# Patient Record
Sex: Female | Born: 1970 | Race: White | Hispanic: No | Marital: Married | Smoking: Never smoker
Health system: Southern US, Community
[De-identification: ages and names within clinical notes are randomized; demographics above are authoritative.]

## PROBLEM LIST (undated history)

## (undated) DIAGNOSIS — E663 Overweight: Secondary | ICD-10-CM

## (undated) DIAGNOSIS — I499 Cardiac arrhythmia, unspecified: Secondary | ICD-10-CM

## (undated) DIAGNOSIS — O24419 Gestational diabetes mellitus in pregnancy, unspecified control: Secondary | ICD-10-CM

## (undated) DIAGNOSIS — F32A Depression, unspecified: Secondary | ICD-10-CM

## (undated) DIAGNOSIS — N809 Endometriosis, unspecified: Secondary | ICD-10-CM

## (undated) DIAGNOSIS — G2581 Restless legs syndrome: Secondary | ICD-10-CM

## (undated) DIAGNOSIS — F329 Major depressive disorder, single episode, unspecified: Secondary | ICD-10-CM

## (undated) DIAGNOSIS — F319 Bipolar disorder, unspecified: Secondary | ICD-10-CM

## (undated) HISTORY — DX: Endometriosis, unspecified: N80.9

## (undated) HISTORY — DX: Gestational diabetes mellitus in pregnancy, unspecified control: O24.419

## (undated) HISTORY — PX: BUNIONECTOMY: SHX129

## (undated) HISTORY — DX: Restless legs syndrome: G25.81

## (undated) HISTORY — PX: CHOLECYSTECTOMY: SHX55

## (undated) HISTORY — DX: Depression, unspecified: F32.A

## (undated) HISTORY — DX: Bipolar disorder, unspecified: F31.9

## (undated) HISTORY — DX: Overweight: E66.3

## (undated) HISTORY — PX: LAPAROSCOPY: SHX197

## (undated) HISTORY — DX: Major depressive disorder, single episode, unspecified: F32.9

## (undated) HISTORY — DX: Cardiac arrhythmia, unspecified: I49.9

## (undated) HISTORY — PX: FOOT SURGERY: SHX648

---

## 1998-08-22 HISTORY — PX: VAGINAL HYSTERECTOMY: SUR661

## 1998-08-22 HISTORY — PX: TOTAL VAGINAL HYSTERECTOMY: SHX2548

## 2008-02-25 ENCOUNTER — Ambulatory Visit: Payer: Self-pay | Admitting: Family Medicine

## 2008-06-22 ENCOUNTER — Ambulatory Visit: Payer: Self-pay | Admitting: Family Medicine

## 2008-06-28 ENCOUNTER — Ambulatory Visit: Payer: Self-pay | Admitting: General Surgery

## 2009-01-18 ENCOUNTER — Ambulatory Visit: Payer: Self-pay | Admitting: Obstetrics and Gynecology

## 2009-04-16 ENCOUNTER — Ambulatory Visit: Payer: Self-pay | Admitting: Family Medicine

## 2010-02-28 DIAGNOSIS — E039 Hypothyroidism, unspecified: Secondary | ICD-10-CM | POA: Insufficient documentation

## 2010-12-24 HISTORY — PX: OTHER SURGICAL HISTORY: SHX169

## 2011-01-23 ENCOUNTER — Ambulatory Visit: Payer: Self-pay | Admitting: Obstetrics and Gynecology

## 2011-02-04 ENCOUNTER — Ambulatory Visit: Payer: Self-pay | Admitting: Obstetrics and Gynecology

## 2012-03-22 ENCOUNTER — Ambulatory Visit: Payer: Self-pay | Admitting: Obstetrics and Gynecology

## 2013-03-23 ENCOUNTER — Ambulatory Visit: Payer: Self-pay | Admitting: Obstetrics and Gynecology

## 2014-06-19 ENCOUNTER — Ambulatory Visit: Payer: Self-pay | Admitting: Obstetrics and Gynecology

## 2014-06-19 LAB — HM MAMMOGRAPHY

## 2014-06-26 LAB — LIPID PANEL
Cholesterol: 175 mg/dL (ref 0–200)
HDL: 52 mg/dL (ref 35–70)
LDL Cholesterol: 112 mg/dL
TRIGLYCERIDES: 56 mg/dL (ref 40–160)

## 2015-01-05 ENCOUNTER — Ambulatory Visit: Payer: Self-pay | Admitting: Family Medicine

## 2015-05-08 ENCOUNTER — Other Ambulatory Visit: Payer: Self-pay | Admitting: Obstetrics and Gynecology

## 2015-05-08 DIAGNOSIS — Z1231 Encounter for screening mammogram for malignant neoplasm of breast: Secondary | ICD-10-CM

## 2015-05-11 LAB — BASIC METABOLIC PANEL
BUN: 5 mg/dL (ref 4–21)
CREATININE: 0.7 mg/dL (ref 0.5–1.1)
Glucose: 71 mg/dL
POTASSIUM: 4.4 mmol/L (ref 3.4–5.3)
Sodium: 140 mmol/L (ref 137–147)

## 2015-05-11 LAB — TSH: TSH: 2.95 u[IU]/mL (ref 0.41–5.90)

## 2015-05-25 ENCOUNTER — Ambulatory Visit: Payer: Self-pay | Admitting: Podiatry

## 2015-05-25 ENCOUNTER — Ambulatory Visit (INDEPENDENT_AMBULATORY_CARE_PROVIDER_SITE_OTHER): Payer: No Typology Code available for payment source | Admitting: Podiatry

## 2015-05-25 ENCOUNTER — Encounter: Payer: Self-pay | Admitting: Podiatry

## 2015-05-25 ENCOUNTER — Ambulatory Visit (INDEPENDENT_AMBULATORY_CARE_PROVIDER_SITE_OTHER): Payer: No Typology Code available for payment source

## 2015-05-25 VITALS — BP 128/86 | HR 68 | Resp 16 | Ht 64.0 in | Wt 170.0 lb

## 2015-05-25 DIAGNOSIS — M779 Enthesopathy, unspecified: Secondary | ICD-10-CM

## 2015-05-25 DIAGNOSIS — M6740 Ganglion, unspecified site: Secondary | ICD-10-CM | POA: Diagnosis not present

## 2015-05-25 DIAGNOSIS — G5792 Unspecified mononeuropathy of left lower limb: Secondary | ICD-10-CM

## 2015-05-25 DIAGNOSIS — M7752 Other enthesopathy of left foot: Secondary | ICD-10-CM

## 2015-05-25 DIAGNOSIS — M778 Other enthesopathies, not elsewhere classified: Secondary | ICD-10-CM

## 2015-05-25 NOTE — Progress Notes (Signed)
   Subjective:    Patient ID: Briana Fox, female    DOB: [birth year], 44 y.o.   MRN: [id number]  HPI knot on the top of the left foot , it has been about a month and it is causing pain to go throughout my foot     Review of Systems     Objective:   Physical Exam: I have reviewed her past medical history medications allergies surgery social history and review of systems. Pulses are strongly palpable bilateral. Neurologic sensorium is intact per Semmes-Weinstein monofilament. Deep tendon reflexes are intact bilateral and muscle strength is 5 over 5 dorsiflexors plantar flexors and inverters everters all intrinsic musculature is intact. Orthopedic evaluation and stripped all joints distal to the ankle, full range of motion without crepitation. She has dorsal spurring and on pulsatile nodules in the dorsal aspect of the second metatarsal intermediate cuneiform joint. This area is tender on palpation with radiating pains distally. Radiographs demonstrate some dorsal spurring but minimally so. Early osteoarthritic changes are noted on AP view.        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  Plan: Injected the left foot today with dexamethasone and local and aesthetic discussed appropriate shoe gear stretching exercises and ice therapy. Discussed the possible need for surgical intervention in the near future.

## 2015-05-28 ENCOUNTER — Ambulatory Visit: Payer: Self-pay | Admitting: Podiatry

## 2015-06-05 ENCOUNTER — Telehealth: Payer: Self-pay | Admitting: *Deleted

## 2015-06-05 MED ORDER — MELOXICAM 7.5 MG PO TABS: 7.5000 mg | ORAL_TABLET | Freq: Every day | ORAL | Status: DC

## 2015-06-05 NOTE — Telephone Encounter (Addendum)
Pt's husband, Abbe Amsterdam called stating pt is requesting a stronger Tylenol medication than the OTC.  Dr. Milinda Pointer ordered Mobic 7.5mg  #30 1 tablet daily, and Tramadol 50mg  #30 one tablet every 8 hours.  Orders to pt and Mobic to [facility] and Caryl Pina of [place] will write for the Tramadol to be picked up in their office.

## 2015-06-06 MED ORDER — TRAMADOL HCL 50 MG PO TABS: 50.0000 mg | ORAL_TABLET | Freq: Three times a day (TID) | ORAL | Status: DC | PRN

## 2015-06-06 NOTE — Telephone Encounter (Signed)
Wrote Tramadol 50 mg #30 for pt and will pick up at front desk at her convenience today.

## 2015-06-07 ENCOUNTER — Ambulatory Visit (INDEPENDENT_AMBULATORY_CARE_PROVIDER_SITE_OTHER): Payer: No Typology Code available for payment source | Admitting: Podiatry

## 2015-06-07 ENCOUNTER — Ambulatory Visit: Payer: No Typology Code available for payment source | Admitting: Podiatry

## 2015-06-07 VITALS — BP 120/71 | HR 86 | Resp 16

## 2015-06-07 DIAGNOSIS — M6740 Ganglion, unspecified site: Secondary | ICD-10-CM

## 2015-06-07 DIAGNOSIS — G5792 Unspecified mononeuropathy of left lower limb: Secondary | ICD-10-CM | POA: Diagnosis not present

## 2015-06-07 DIAGNOSIS — M779 Enthesopathy, unspecified: Secondary | ICD-10-CM

## 2015-06-07 DIAGNOSIS — M778 Other enthesopathies, not elsewhere classified: Secondary | ICD-10-CM

## 2015-06-07 DIAGNOSIS — M7752 Other enthesopathy of left foot: Secondary | ICD-10-CM | POA: Diagnosis not present

## 2015-06-07 NOTE — Progress Notes (Signed)
Patient ID: Briana Fox, female   DOB: March 24, 1971, 44 y.o.   MRN: [id number]  Subjective: 44 year old female presents the office they for followup evaluation of left foot pain which has been ongoing for several months. She last saw Dr. Milinda Pointer earlier this month which had a steroid injection to the area which seem to help for approximately 2 weeks before the pain started to recur. She is also taking anti-inflammatories which seem to help some although she does continue pain on the top of her foot. She also states that she has some numbness and tingling shooting over this lesion down to her toes and between the hallux and second toe. She does have some pain in the outside aspect of her foot with weightbearing and prolonged ambulation. She denies any history of injury or trauma. Denies any systemic complaints such as fevers, chills, nausea, vomiting. No acute changes since last appointment, and no other complaints at this time.   Objective: AAO x3, NAD DP/PT pulses palpable bilaterally, CRT less than 3 seconds Protective sensation intact with Simms Weinstein monofilament, vibratory sensation intact, Achilles tendon reflex intact On the dorsal aspect of the left midfoot just lateral to the dorsalis pedis there is a small and calluses fluid-filled soft tissue mass with underlying bony prominences palpable. There is tenderness to palpation overlying this area. Upon percussion the area does appear to be radiation of the deep peroneal nerve in between the hallux and second digit. Subjectively there is decreased sensation to the lateral hallux and medial second digit. There is an increase in medial arch height upon weightbearing. Upon gait evaluation she does tend to walk on the lateral aspect of her foot. Mild tailor's bunion deformity.  No other areas of pinpoint bony tenderness or pain with vibratory sensation. MMT 5/5, ROM WNL. No edema, erythema, increase in warmth to bilateral lower extremities.  No open  lesions or pre-ulcerative lesions.  No pain with calf compression, swelling, warmth, erythema  Assessment: 44 year old female with left dorsal midfoot exostosis, soft tissue mass likely causing neuritis symptoms.  Plan: -Previous x-rays were discussed reviewed the patient. -All treatment options discussed with the patient including all alternatives, risks, complications.  -At this time the patient has attempted conservative treatment including but not limited to injections, anti-inflammatories, shoe gear modifications, padding, offloading without any resolution of symptoms. At this time she is requesting surgical intervention to help decrease her pain and deformity. -Discussed surgical intervention with the patient including left dorsal midfoot exostectomy a soft tissue mass excision. Should have the surgery performed on Friday and would like Dr. Milinda Pointer to preform the surgery. I will discuss this with him.  -I discussed the surgery will hopefully help decrease some of the symptoms she has to to her foot and we have reviewed all of symptoms. Given her high arch foot she would likely benefit from orthotics will help take pressure off of her foot. -The incision placement as well as the postoperative course was discussed with the patient. I discussed risks of the surgery which include, but not limited to, infection, bleeding, pain, swelling, need for further surgery, delayed or nonhealing, painful or ugly scar, numbness or sensation changes, over/under correction, recurrence, transfer lesions, further deformity, hardware failure, DVT/PE, loss of toe/foot. Patient understands these risks and wishes to proceed with surgery. The surgical consent was reviewed with the patient all 3 pages were signed. No promises or guarantees were given to the outcome of the procedure. All questions were answered to the best of my  ability. Before the surgery the patient was encouraged to call the office if there is any further  questions. The surgery will be performed at the [facility] on an outpatient basis.  -Patient encouraged to call the office with any questions, concerns, change in symptoms.

## 2015-06-07 NOTE — Patient Instructions (Signed)
Pre-Operative Instructions  Congratulations, you have decided to take an important step to improving your quality of life.  You can be assured that the doctors of [facility] will be with you every step of the way.  1. Plan to be at the surgery center/hospital at least 1 (one) hour prior to your scheduled time unless otherwise directed by the surgical center/hospital staff.  You must have a responsible adult accompany you, remain during the surgery and drive you home.  Make sure you have directions to the surgical center/hospital and know how to get there on time. 2. For hospital based surgery you will need to obtain a history and physical form from your family physician within 1 month prior to the date of surgery- we will give you a form for you primary physician.  3. We make every effort to accommodate the date you request for surgery.  There are however, times where surgery dates or times have to be moved.  We will contact you as soon as possible if a change in schedule is required.   4. No Aspirin/Ibuprofen for one week before surgery.  If you are on aspirin, any non-steroidal anti-inflammatory medications (Mobic, Aleve, Ibuprofen) you should stop taking it 7 days prior to your surgery.  You make take Tylenol  For pain prior to surgery.  5. Medications- If you are taking daily heart and blood pressure medications, seizure, reflux, allergy, asthma, anxiety, pain or diabetes medications, make sure the surgery center/hospital is aware before the day of surgery so they may notify you which medications to take or avoid the day of surgery. 6. No food or drink after midnight the night before surgery unless directed otherwise by surgical center/hospital staff. 7. No alcoholic beverages 24 hours prior to surgery.  No smoking 24 hours prior to or 24 hours after surgery. 8. Wear loose pants or shorts- loose enough to fit over bandages, boots, and casts. 9. No slip on shoes, sneakers are best. 10. Bring  your boot with you to the surgery center/hospital.  Also bring crutches or a walker if your physician has prescribed it for you.  If you do not have this equipment, it will be provided for you after surgery. 11. If you have not been contracted by the surgery center/hospital by the day before your surgery, call to confirm the date and time of your surgery. 12. Leave-time from work may vary depending on the type of surgery you have.  Appropriate arrangements should be made prior to surgery with your employer. 13. Prescriptions will be provided immediately following surgery by your doctor.  Have these filled as soon as possible after surgery and take the medication as directed. 14. Remove nail polish on the operative foot. 15. Wash the night before surgery.  The night before surgery wash the foot and leg well with the antibacterial soap provided and water paying special attention to beneath the toenails and in between the toes.  Rinse thoroughly with water and dry well with a towel.  Perform this wash unless told not to do so by your physician.  Enclosed: 1 Ice pack (please put in freezer the night before surgery)   1 Hibiclens skin cleaner   Pre-op Instructions  If you have any questions regarding the instructions, do not hesitate to call our office.  [place]: [address] [place], [place] [address] [phone]  [place]: [address] , [place] [address] [phone]  [place]: [address]  [place], [place] [address] [phone]  Dr. Richard   Tuchman DPM, Dr. Norman Regal DPM Dr. Richard Sikora DPM, Dr. M. Todd Hyatt DPM, Dr. Kathryn Egerton DPM 

## 2015-06-22 ENCOUNTER — Encounter: Payer: Self-pay | Admitting: Podiatry

## 2015-06-27 ENCOUNTER — Other Ambulatory Visit: Payer: Self-pay | Admitting: Podiatry

## 2015-06-27 MED ORDER — PROMETHAZINE HCL 25 MG PO TABS: 25.0000 mg | ORAL_TABLET | Freq: Three times a day (TID) | ORAL | Status: DC | PRN

## 2015-06-27 MED ORDER — CEPHALEXIN 500 MG PO CAPS: 500.0000 mg | ORAL_CAPSULE | Freq: Three times a day (TID) | ORAL | Status: DC

## 2015-06-27 MED ORDER — OXYCODONE-ACETAMINOPHEN 10-325 MG PO TABS: 1.0000 | ORAL_TABLET | Freq: Four times a day (QID) | ORAL | Status: DC | PRN

## 2015-06-29 DIAGNOSIS — M25775 Osteophyte, left foot: Secondary | ICD-10-CM | POA: Diagnosis not present

## 2015-06-29 DIAGNOSIS — M6740 Ganglion, unspecified site: Secondary | ICD-10-CM | POA: Diagnosis not present

## 2015-07-05 ENCOUNTER — Ambulatory Visit (INDEPENDENT_AMBULATORY_CARE_PROVIDER_SITE_OTHER): Payer: No Typology Code available for payment source

## 2015-07-05 ENCOUNTER — Ambulatory Visit (INDEPENDENT_AMBULATORY_CARE_PROVIDER_SITE_OTHER): Payer: No Typology Code available for payment source | Admitting: Podiatry

## 2015-07-05 VITALS — BP 120/71 | HR 77 | Temp 97.8°F | Resp 16

## 2015-07-05 DIAGNOSIS — Z9889 Other specified postprocedural states: Secondary | ICD-10-CM

## 2015-07-05 DIAGNOSIS — M898X9 Other specified disorders of bone, unspecified site: Secondary | ICD-10-CM

## 2015-07-06 NOTE — Progress Notes (Signed)
Patient ID: Briana Fox, female   DOB: 10/09/1971, 44 y.o.   MRN: [id number]  DOS: 06/29/15 s/p Left dorsal exostectomy, soft tissue mass excision  Subjective: 44 year old female presents the office today postop visit #1 status post left dorsal exostosis with Dr. Milinda Pointer. She states that overall she is having more pain that she's had from her other surgeries. She is increase the frequency that she is taking the Percocet which seems to help. She does that she tries to care for her grandson and she is on her feet quite a bit. She's been taking antibiotic as directed and she didn't need significant again for couple days after surgery due to nausea however the has resolved. She denies any systemic complaints as fevers, chills, nausea, vomiting. Denies any calf pain, chest pain, shortness of breath. No other complaints at this time in no acute changes.  Objective: AAO x3, NAD DP/PT pulses palpable bilaterally, CRT less than 3 seconds Protective sensation intact with Simms Weinstein monofilament  Incisional a dorsal aspect of the left midfoot as well as left without any evidence of dehiscence and sutures are intact. There is edema overlying the surgical site. There is a faint amount of erythema likely from inflammation surrounding the incision however there is no increase in warmth to the area and there is no ascending cellulitis. There is no drainage, purulence, malodor. There is mild tenderness to palpation overlying the incision.  No other areas of tenderness to bilateral lower extremities. MMT 5/5, ROM WNL.  No open lesions or pre-ulcerative lesions.  No overlying edema, erythema, increase in warmth to bilateral lower extremities.  No pain with calf compression, swelling, warmth, erythema bilaterally.   Assessment: 44 year old female 1 week status post left dorsal exostectomy  Plan: -Treatment options discussed including all alternatives, risks, and complications -X-rays were obtained and reviewed  with the patient.  -Antibiotic ointment was placed over the incision followed by dry sterile dressing. Keep his dressing clean, dry, intact. -Continue a surgical shoe. -Continue pain medication. -Monitor for any clinical signs or symptoms of infection and directed to call the office immediately should any occur or go to the ER. -Follow-up 1 week for suture removal or sooner if any problems arise. In the meantime, encouraged to call the office with any questions, concerns, change in symptoms.   Celesta Gentile, DPM

## 2015-07-09 ENCOUNTER — Telehealth: Payer: Self-pay | Admitting: *Deleted

## 2015-07-09 MED ORDER — OXYCODONE-ACETAMINOPHEN 10-325 MG PO TABS
1.0000 | ORAL_TABLET | Freq: Four times a day (QID) | ORAL | Status: DC | PRN
Start: 2015-07-09 — End: 2016-06-17

## 2015-07-09 NOTE — Telephone Encounter (Addendum)
Pt states she would like a refill of her Percocet.  Informed pt that the Percocet could be refilled, but she would have to pick it up in the [place] office.  Pt states she will have her husband pick it up.

## 2015-07-09 NOTE — Telephone Encounter (Signed)
OK to refill

## 2015-07-16 ENCOUNTER — Ambulatory Visit (INDEPENDENT_AMBULATORY_CARE_PROVIDER_SITE_OTHER): Payer: No Typology Code available for payment source | Admitting: Podiatry

## 2015-07-16 VITALS — BP 129/72 | HR 72 | Temp 99.1°F | Resp 16

## 2015-07-16 DIAGNOSIS — Z9889 Other specified postprocedural states: Secondary | ICD-10-CM

## 2015-07-16 NOTE — Progress Notes (Signed)
She presents today 2 weeks status post excision dorsal exostosis left foot. 2 weeks status post decompression of deep peroneal nerve left foot. Currently she was to get the foot wet as soon as she possibly can.  Objective: Dry sterile dressing was removed demonstrates palpable pulse left incision line appears to be healing but there is mild dehiscence of the proximal most portion of the wound. I see no signs of infection just some blood. Vital signs are stable she is alert and oriented 3. Pulses are palpable.  Assessment: Well-healing surgical foot mild dehiscence proximal wound left dorsal.  Plan: Redressed today for the next 2 weeks continue to keep dry for 2 more weeks follow up with me at that time.

## 2015-07-18 NOTE — Progress Notes (Signed)
DOS 06/22/2015 Left foot exostectomy (removal of bone spur) with removal of soft tissue mass.

## 2015-08-01 ENCOUNTER — Ambulatory Visit (INDEPENDENT_AMBULATORY_CARE_PROVIDER_SITE_OTHER): Payer: No Typology Code available for payment source

## 2015-08-01 ENCOUNTER — Ambulatory Visit (INDEPENDENT_AMBULATORY_CARE_PROVIDER_SITE_OTHER): Payer: No Typology Code available for payment source | Admitting: Podiatry

## 2015-08-01 VITALS — BP 98/60 | HR 80 | Resp 16

## 2015-08-01 DIAGNOSIS — M898X9 Other specified disorders of bone, unspecified site: Secondary | ICD-10-CM

## 2015-08-01 DIAGNOSIS — Z9889 Other specified postprocedural states: Secondary | ICD-10-CM

## 2015-08-01 NOTE — Progress Notes (Signed)
She presents today for follow-up of a dorsal tarsal exostectomy and a deep peroneal nerve decompression left foot. She relates some numbness but states that all in all seems to be doing much better. She states that she's been dancing.  Objective: Vital signs are stable she is alert and oriented 3. Pulses are palpable no erythema edema cellulitis drainage or odor sutures are intact margins well coapted.  Assessment: Well-healing surgical dorsal tarsal exostectomy left foot.  Plan: get back to regular shoe gear and I will follow-up with her about a month.

## 2015-08-23 ENCOUNTER — Encounter: Payer: Self-pay | Admitting: Podiatry

## 2015-08-23 ENCOUNTER — Ambulatory Visit (INDEPENDENT_AMBULATORY_CARE_PROVIDER_SITE_OTHER): Payer: No Typology Code available for payment source

## 2015-08-23 ENCOUNTER — Ambulatory Visit (INDEPENDENT_AMBULATORY_CARE_PROVIDER_SITE_OTHER): Payer: No Typology Code available for payment source | Admitting: Podiatry

## 2015-08-23 VITALS — BP 117/68 | HR 59 | Resp 16

## 2015-08-23 DIAGNOSIS — M79672 Pain in left foot: Secondary | ICD-10-CM | POA: Diagnosis not present

## 2015-08-23 DIAGNOSIS — M2042 Other hammer toe(s) (acquired), left foot: Secondary | ICD-10-CM

## 2015-08-23 DIAGNOSIS — S92912A Unspecified fracture of left toe(s), initial encounter for closed fracture: Secondary | ICD-10-CM

## 2015-08-23 NOTE — Progress Notes (Signed)
Patient ID: Briana Fox, female   DOB: 28-Sep-1971, 44 y.o.   MRN: [id number]  Subjective: 44 year old female presents the office with concerns of left fifth toe pain. She states that she hit her fifth toe and chair approximately 3 weeks ago and since she's had pain to the area as well as swelling and redness. She states that she is unable to wear a regular shoe 2 the pain and swelling. She's tried to tape her toes the other which has become very painful. She also states that her fifth toe is starting to drift outwards more than she injured it. Otherwise she's had no other treatment. She states that he often gets her fifth toe caught as it is "floppy". She states that she gets some intermittent swelling after the surgery but that is improving. She has no pain to the surgical site.No other complaints time in no acute changes.  Objective: AAO x3, NAD DP/PT pulses palpable, CRT less than 3 seconds Protective sensation appears to be intact with Derrel Nip monofilament There is mild edema and erythema to the left fifth toe. There is tenderness to palpation over the distal aspect of the digit. Currently there is no tenderness or swelling around the fifth metatarsal head however subjectively she does that she has pain and swelling there as she is on her feet more. The fifth toe does sit in a slightly abducted position compared to the other digits. There is no other areas of tenderness to bilateral lower extremities. The scar is well formed on the dorsal aspect of the midfoot from recent surgery. There is traceedema upon area. There is no overlying erythema or increase in warmth. There is notation incision there is no sign of infection. No open lesions or pre-ulcer lesions. No pain with calf compression, swelling, warmth, erythema.  Assessment: 44 year old female with left fifth toe fracture, with chronic deformity fifth toe.  Plan: -Treatment options discussed including all alternatives, risks, and  complications' -X-rays were obtained and reviewed with the patient. There is a radiolucent line across the distal portion of the toe consistent with a fracture. -Continue taping of the toe. I discussed to wear surgical shoe as needed which she has a home. Ice and elevation. -In the future due to the abducted position of the toe I discussed with her webbing procedure the fourth and fifth toe. She states she lately undergo this in the fall. -Follow-up in 4 weeks or sooner if any problems arise. In the meantime, encouraged to call the office with any questions, concerns, change in symptoms. We'll likely discuss surgery at the next appointment.  Celesta Gentile, DPM

## 2015-09-03 ENCOUNTER — Encounter: Payer: No Typology Code available for payment source | Admitting: Podiatry

## 2015-09-06 ENCOUNTER — Encounter: Payer: Self-pay | Admitting: Podiatry

## 2015-09-06 ENCOUNTER — Ambulatory Visit (INDEPENDENT_AMBULATORY_CARE_PROVIDER_SITE_OTHER): Payer: No Typology Code available for payment source

## 2015-09-06 ENCOUNTER — Ambulatory Visit (INDEPENDENT_AMBULATORY_CARE_PROVIDER_SITE_OTHER): Payer: No Typology Code available for payment source | Admitting: Podiatry

## 2015-09-06 VITALS — BP 130/69 | HR 77 | Resp 18

## 2015-09-06 DIAGNOSIS — R52 Pain, unspecified: Secondary | ICD-10-CM

## 2015-09-06 DIAGNOSIS — M779 Enthesopathy, unspecified: Secondary | ICD-10-CM

## 2015-09-06 DIAGNOSIS — M2042 Other hammer toe(s) (acquired), left foot: Secondary | ICD-10-CM

## 2015-09-06 DIAGNOSIS — M792 Neuralgia and neuritis, unspecified: Secondary | ICD-10-CM

## 2015-09-06 DIAGNOSIS — S92912D Unspecified fracture of left toe(s), subsequent encounter for fracture with routine healing: Secondary | ICD-10-CM

## 2015-09-06 MED ORDER — DICLOFENAC SODIUM 1 % TD GEL: 2.0000 g | Freq: Four times a day (QID) | TRANSDERMAL | Status: DC

## 2015-09-06 NOTE — Progress Notes (Signed)
Patient ID: Briana Fox, female   DOB: 1971-03-15, 44 y.o.   MRN: [id number]  Subjective: 44 year old female presents the office today for follow-up evaluation left fifth toe fracture. She states that she is doing well and she is able to wear a regular shoe. She has no discomfort to the toe and she states the swelling has decreased. She discontinues her that her fifth toe get caught if she goes barefoot which causes pain intermittently. She elected to further discuss surgery for the toe. She states that she is having some discomfort around the surgical site over the previous exostectomy. She states that she was doing well until she started to increase her dancing as he gets pain over the surgical site which goes into her ankle. She states that she has had to stop dancing due to the pain. She states that she doesn't have pain with vibratory past she increases activity she has discomfort. She describes it as a burning pain as well as a throbbing pain into her ankle. She purchase an over-the-counter ankle brace which is not providing much support and she is inquiring about other braces. No other complaints at this time.  Objective: AAO 3, NAD Neurovascular status intact and unchanged Incisional the dorsal aspect of the left foot along the recent surgery is well coapted and the scar has formed. There is no tenderness to palpation overlying the area. Negative Tinel sign. Subjective there is pain on the anterior aspect of the ankle on the proximal portion of the incision. There is no pinpoint bony tenderness or pain the vibratory sensation. There is no restriction or crepitation with ankle joint range of motion. 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 At this time there is no tenderness to palpation overlying the fifth digit there is no swelling. Subjectively she states the fifth toe causes pain and she gets toe caught. The fifth toe is somewhat "floppy". No other areas of  tenderness to bilateral lower joints. No other open lesions or pre-ulcerative lesions. There is no pain with calf compression, swelling, warmth, erythema.  Assessment: 44 year old female with fifth toe pain, hammertoe fourth toe; discomfort with activity on the proximal portion of the incision from the exostectomy; healing fracture fifth toe  Plan: -X-rays were obtained and reviewed with the patient.  -Treatment options discussed including all alternatives, risks, and complications -At this time she continues to the fifth toe for stabilization help with swelling as needed. Appears to be healing on x-ray. No further problems at this time. Continue with regular shoe gear as tolerated. 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 She would have surgery in October 26. Date was reserved for her. We will do the consent closer to do surgery. -Dispensed Tri-Lock ankle brace to wear as needed when dancing. Prescribed Voltaren gel. Rehabilitation exercises discussed. -Follow-up in 3 weeks or sooner if any problems arise. In the meantime, encouraged to call the office with any questions, concerns, change in symptoms.   Celesta Gentile, DPM

## 2015-09-27 ENCOUNTER — Telehealth: Payer: Self-pay | Admitting: *Deleted

## 2015-09-27 ENCOUNTER — Ambulatory Visit (INDEPENDENT_AMBULATORY_CARE_PROVIDER_SITE_OTHER): Payer: No Typology Code available for payment source

## 2015-09-27 ENCOUNTER — Encounter: Payer: Self-pay | Admitting: Podiatry

## 2015-09-27 ENCOUNTER — Ambulatory Visit (INDEPENDENT_AMBULATORY_CARE_PROVIDER_SITE_OTHER): Payer: No Typology Code available for payment source | Admitting: Podiatry

## 2015-09-27 VITALS — BP 117/73 | HR 76 | Resp 18

## 2015-09-27 DIAGNOSIS — M2528 Flail joint, other site: Secondary | ICD-10-CM | POA: Diagnosis not present

## 2015-09-27 DIAGNOSIS — M79672 Pain in left foot: Secondary | ICD-10-CM

## 2015-09-27 DIAGNOSIS — R52 Pain, unspecified: Secondary | ICD-10-CM | POA: Diagnosis not present

## 2015-09-27 DIAGNOSIS — Z9889 Other specified postprocedural states: Secondary | ICD-10-CM

## 2015-09-27 DIAGNOSIS — M2042 Other hammer toe(s) (acquired), left foot: Secondary | ICD-10-CM | POA: Diagnosis not present

## 2015-09-27 NOTE — Telephone Encounter (Signed)
Entered in error.  Patient has 2 charts. 

## 2015-09-27 NOTE — Patient Instructions (Signed)
Pre-Operative Instructions  Congratulations, you have decided to take an important step to improving your quality of life.  You can be assured that the doctors of [facility] will be with you every step of the way.  1. Plan to be at the surgery center/hospital at least 1 (one) hour prior to your scheduled time unless otherwise directed by the surgical center/hospital staff.  You must have a responsible adult accompany you, remain during the surgery and drive you home.  Make sure you have directions to the surgical center/hospital and know how to get there on time. 2. For hospital based surgery you will need to obtain a history and physical form from your family physician within 1 month prior to the date of surgery- we will give you a form for you primary physician.  3. We make every effort to accommodate the date you request for surgery.  There are however, times where surgery dates or times have to be moved.  We will contact you as soon as possible if a change in schedule is required.   4. No Aspirin/Ibuprofen for one week before surgery.  If you are on aspirin, any non-steroidal anti-inflammatory medications (Mobic, Aleve, Ibuprofen) you should stop taking it 7 days prior to your surgery.  You make take Tylenol  For pain prior to surgery.  5. Medications- If you are taking daily heart and blood pressure medications, seizure, reflux, allergy, asthma, anxiety, pain or diabetes medications, make sure the surgery center/hospital is aware before the day of surgery so they may notify you which medications to take or avoid the day of surgery. 6. No food or drink after midnight the night before surgery unless directed otherwise by surgical center/hospital staff. 7. No alcoholic beverages 24 hours prior to surgery.  No smoking 24 hours prior to or 24 hours after surgery. 8. Wear loose pants or shorts- loose enough to fit over bandages, boots, and casts. 9. No slip on shoes, sneakers are best. 10. Bring  your boot with you to the surgery center/hospital.  Also bring crutches or a walker if your physician has prescribed it for you.  If you do not have this equipment, it will be provided for you after surgery. 11. If you have not been contracted by the surgery center/hospital by the day before your surgery, call to confirm the date and time of your surgery. 12. Leave-time from work may vary depending on the type of surgery you have.  Appropriate arrangements should be made prior to surgery with your employer. 13. Prescriptions will be provided immediately following surgery by your doctor.  Have these filled as soon as possible after surgery and take the medication as directed. 14. Remove nail polish on the operative foot. 15. Wash the night before surgery.  The night before surgery wash the foot and leg well with the antibacterial soap provided and water paying special attention to beneath the toenails and in between the toes.  Rinse thoroughly with water and dry well with a towel.  Perform this wash unless told not to do so by your physician.  Enclosed: 1 Ice pack (please put in freezer the night before surgery)   1 Hibiclens skin cleaner   Pre-op Instructions  If you have any questions regarding the instructions, do not hesitate to call our office.  [place]: [address] [place], [place] [address] [phone]  [place]: [address] [place], [place] [address] [phone]  [place]: [address]  [place], [place] [address] [phone]  Dr. Richard   Tuchman DPM, Dr. Norman Regal DPM Dr. Richard Sikora DPM, Dr. M. Todd Hyatt DPM, Dr. Kathryn Egerton DPM, Dr. Matthew Wagoner DPM 

## 2015-09-27 NOTE — Telephone Encounter (Signed)
"  I had an appointment today with Dr. Jacqualyn Posey.  They told me to call you to set up the surgery."  When would you like to schedule it?  "I'd like to do it on October 26th."  I already have you down for that date.  "That's great, I didn't know if they had told you or not."

## 2015-09-28 NOTE — Progress Notes (Signed)
Patient ID: Briana Fox, female   DOB: March 11, 1971, 44 y.o.   MRN: [id number]  Subjective: 44 year old female presents the office they for follow-up evaluation of left foot dorsal exostosis. She states that she's been using the Voltaren gel and she has had significant improvement in her pain. She states that she is back to dancing and daily activities without any problems. She gets some occasional achiness to the area but it is improving. She denies any swelling to the area or any redness. She's able to wear regular shoe. She also continues to state her left fifth toe gets caught and is "floppy". At this time she'll to further discuss surgery of the fifth toe. No complaints this time in no acute changes.  Objective: AAO 3, NAD Neurovascular status intact and unchanged Incisions on the dorsal aspect left foot as well coapted with the scar. There is mild discomfort upon palpation to the area however there is no significant tenderness. There is no overlying edema, erythema, increase in warmth. There is a decreased range of motion of the first MTPJ. Scars overlying the area which is well-healed from prior surgery. There is a scar on the fifth toe from prior surgery as well as the fourth toe. The fourth toe sits in a slightly adductovarus position as well as the fifth toe. The fifth toe does appear to be floppy. There is tenderness to palpation upon the fifth digit as well as the dorsal fourth digit. No other areas of tenderness to bilateral lower x-rays. There is no open edema, erythema, increase in warmth. There is no pain with calf compression, swelling, warmth, erythema.  Assessment: 44 year old female with resolving pain on surgical site dorsal exostosis, fourth and fifth digit pain left foot  Plan: -Previous x-rays were reviewed and discussed with the patient. -Treatment options discussed including all alternatives, risks, and complications discussed both conservative and surgical treatment  options. -At this time should pursue surgical intervention for the left foot fourth and fifth toes. Given the previous surgery to the fifth toe I recommend syndactylization of the fourth and fifth toe. She also developed fourth toe straightened. Discussed the possible PIPJ arthrodesis with K wire fixation. She would like to pursue surgery at this time. -The incision placement as well as the postoperative course was discussed with the patient. I discussed risks of the surgery which include, but not limited to, infection, bleeding, pain, swelling, need for further surgery, delayed or nonhealing, painful or ugly scar, numbness or sensation changes, over/under correction, recurrence, transfer lesions, further deformity, hardware failure, DVT/PE, loss of toe/foot. Patient understands these risks and wishes to proceed with surgery. The surgical consent was reviewed with the patient all 3 pages were signed. No promises or guarantees were given to the outcome of the procedure. All questions were answered to the best of my ability. Before the surgery the patient was encouraged to call the office if there is any further questions. The surgery will be performed at the [facility] on an outpatient basis.  Celesta Gentile, DPM

## 2015-10-05 ENCOUNTER — Telehealth: Payer: Self-pay | Admitting: *Deleted

## 2015-10-05 NOTE — Telephone Encounter (Signed)
I called to check patient's benefits and to see if pre-certification was needed for outpatient surgery scheduled on 10/17/2015 for Hammer Toe Repair 4th and Webbing Procedure.  Claudette stated patient has met $1500 deductible, co-pay of 20%. Effective date is 02/19/2013.  Open access plan, no referral nor pre-cert is needed for these procedures.  Reference number for the call is [id number].

## 2015-10-17 ENCOUNTER — Encounter: Payer: Self-pay | Admitting: Podiatry

## 2015-10-17 DIAGNOSIS — Q7032 Webbed toes, left foot: Secondary | ICD-10-CM | POA: Diagnosis not present

## 2015-10-17 DIAGNOSIS — M2042 Other hammer toe(s) (acquired), left foot: Secondary | ICD-10-CM | POA: Diagnosis not present

## 2015-10-23 ENCOUNTER — Encounter: Payer: Self-pay | Admitting: Podiatry

## 2015-10-25 ENCOUNTER — Encounter: Payer: Self-pay | Admitting: Podiatry

## 2015-10-25 ENCOUNTER — Ambulatory Visit (INDEPENDENT_AMBULATORY_CARE_PROVIDER_SITE_OTHER): Payer: No Typology Code available for payment source | Admitting: Podiatry

## 2015-10-25 ENCOUNTER — Ambulatory Visit (INDEPENDENT_AMBULATORY_CARE_PROVIDER_SITE_OTHER): Payer: No Typology Code available for payment source

## 2015-10-25 VITALS — BP 114/70 | HR 70 | Resp 18

## 2015-10-25 DIAGNOSIS — M2042 Other hammer toe(s) (acquired), left foot: Secondary | ICD-10-CM | POA: Diagnosis not present

## 2015-10-25 DIAGNOSIS — M2528 Flail joint, other site: Secondary | ICD-10-CM

## 2015-10-25 DIAGNOSIS — Z9889 Other specified postprocedural states: Secondary | ICD-10-CM

## 2015-10-25 NOTE — Progress Notes (Signed)
Patient ID: Briana Fox, female   DOB: Sep 17, 1971, 44 y.o.   MRN: [id number]  DOS: 10-17-15 s/p Left fourth and fifth digits syndactyly and hammertoe repair fourth toe.  Subjective: 44 year old female presents the office today one week status post left foot surgery. She says that overall she is doing well and she is not having much pain. She did the medication first after surgery however she's not taking any further medication. She is taken antibiotics as directed. She's been in the surgical shoe. She denies any systemic complaints as fevers, chills, nausea, vomiting. No calf pain, chest pain, shortness of breath.  Objective: AAO x3, NAD DP/PT pulses palpable 2/4, CRT less than 3 seconds Protective sensation intact with Simms Weinstein monofilament Incision is well coapted without evidence of dehiscence on the fourth toe and on the interspace the fourth and fifth toes. There is no swelling erythema, ascending cellulitis, fluctuance, crepitus, malodor, drainage/purulence. There is minimal edema the surgical site. No clinical signs of infection. There is no tenderness to palpation of the surgical site. No areas of tenderness work shoes. No pain with calf compression, swelling, warmth, erythema.   Assessment: 44 year old female 1 week status post left foot surgery, doing well  Plan: -X-rays were obtained and reviewed with the patient.  -Treatment options discussed including all alternatives, risks, and complications -Antibiotic ointment was of the incisions followed by dry sterile dressing. Keep dressing clean, dry, intact. -Continue a surgical shoe at all times. -Ice elevation. -Pain medication as needed -Follow-up in 1 week for possible suture removal of the toe or sooner if any problems arise. In the meantime, encouraged to call the office with any questions, concerns, change in symptoms.   Celesta Gentile, DPM

## 2015-10-30 ENCOUNTER — Ambulatory Visit (INDEPENDENT_AMBULATORY_CARE_PROVIDER_SITE_OTHER): Payer: No Typology Code available for payment source | Admitting: Podiatry

## 2015-10-30 ENCOUNTER — Encounter: Payer: Self-pay | Admitting: Podiatry

## 2015-10-30 VITALS — BP 105/69 | HR 68 | Resp 12

## 2015-10-30 DIAGNOSIS — M2042 Other hammer toe(s) (acquired), left foot: Secondary | ICD-10-CM

## 2015-10-30 DIAGNOSIS — M2528 Flail joint, other site: Secondary | ICD-10-CM

## 2015-10-30 DIAGNOSIS — Z9889 Other specified postprocedural states: Secondary | ICD-10-CM

## 2015-10-30 NOTE — Progress Notes (Signed)
Patient ID: Briana Fox, female   DOB: [birth year], 44 y.o.   MRN: [id number]  DOS: 10-17-15 s/p Left fourth and fifth digits syndactyly and hammertoe repair fourth toe.  Subjective: 44 year old female presents the office today 2 weeks status post left foot surgery. She presents today for suture removal. He states that overall she is doing well to the toes. She did go to a concert this past weekend in which she did a lot of standing and walking but she had no pain to the toes. She does get some continued pain to the top of her foot on the site of the previous exostectomy. She states that this weekend she was standing and walking quite a bit she had pain. This could be due to the surgical shoe that she was and however she does have high arch foot as well. She denies any recent injury or trauma. She states the pain to her toes is controlled and she is not taking any pain medicine. She's been in the surgical shoe. She denies any systemic complaints as fevers, chills, nausea, vomiting. No calf pain, chest pain, shortness of breath.  Objective: AAO x3, NAD DP/PT pulses palpable 2/4, CRT less than 3 seconds Protective sensation intact with Simms Weinstein monofilament Incision is well coapted without evidence of dehiscence on the fourth toe and on the interspace the fourth and fifth toes. There is no surrounding erythema, ascending cellulitis, fluctuance, crepitus, malodor, drainage/purulence. There is minimal edema the surgical site. No clinical signs of infection. There is no tenderness to palpation of the surgical site.  There is mild tenderness to palpation along the dorsal aspect of the left midfoot along the site of the previous exostectomy. There is no specific area pinpoint bony tenderness however there is mild diffuse pain. There is an increase in calcaneal inclination angle and a cavus foot type is present. No areas of tenderness to bilateral lower extremities. No pain with calf compression, swelling,  warmth, erythema.   Assessment: 44 year old female 2 weeks status post left foot surgery, doing well  Plan: -Treatment options discussed including all alternatives, risks, and complications -Sutures on the fourth toe are removed without complications. Antibiotic ointment was of the incisions followed by dry sterile dressing. Keep dressing clean, dry, intact. She can start to shower tomorrow as long as the incisions remain closed. -Continue a surgical shoe at all times. -Ice elevation. -Pain medication as needed -Follow-up in 2 weeks or sooner if any problems arise. In the meantime, encouraged to call the office with any questions, concerns, change in symptoms.   Celesta Gentile, DPM

## 2015-11-22 ENCOUNTER — Ambulatory Visit: Payer: No Typology Code available for payment source | Admitting: Podiatry

## 2015-11-23 NOTE — Progress Notes (Signed)
Patient ID: Briana Fox, female   DOB: 1971/10/19, 44 y.o.   MRN: [id number] Dr Jacqualyn Posey performed a left foot webbing of 4th and 5th toes, hammertoe repair 4th toe with k-wire

## 2015-11-27 ENCOUNTER — Ambulatory Visit (INDEPENDENT_AMBULATORY_CARE_PROVIDER_SITE_OTHER): Payer: No Typology Code available for payment source

## 2015-11-27 ENCOUNTER — Ambulatory Visit (INDEPENDENT_AMBULATORY_CARE_PROVIDER_SITE_OTHER): Payer: No Typology Code available for payment source | Admitting: Podiatry

## 2015-11-27 DIAGNOSIS — M2042 Other hammer toe(s) (acquired), left foot: Secondary | ICD-10-CM

## 2015-11-27 DIAGNOSIS — Z9889 Other specified postprocedural states: Secondary | ICD-10-CM

## 2015-11-28 DIAGNOSIS — M79673 Pain in unspecified foot: Secondary | ICD-10-CM

## 2015-11-28 NOTE — Progress Notes (Signed)
Patient ID: Briana Fox, female   DOB: 1971-07-05, 44 y.o.   MRN: [id number]  DOS: 10-17-15 s/p Left fourth and fifth digits syndactyly and hammertoe repair fourth toe.  Subjective: 44 year old female presents the office today status post left foot surgery. She states that she is a Location manager fourth and fifth toe although she was of the fourth toe estimated turn somewhat. She does that she continues have pain in the top of her foot along the exostectomy site. She'll remain in the surgical shoe. She has not been exercising and dancing. She states the swelling has significantly improved and she has not noticed much recently. Denies any warmth or redness. No recent injury or trauma. No other complaints at this time. She denies any systemic complaints as fevers, chills, nausea, vomiting. No calf pain, chest pain, shortness of breath.  Objective: AAO x3, NAD DP/PT pulses palpable 2/4, CRT less than 3 seconds Protective sensation intact with Simms Weinstein monofilament Incision is well coapted without evidence of dehiscence on the fourth toe and on the interspace the fourth and fifth toes. There is a small scab overlying portion of the incision the fourth toe. Some sutures still remaining on the interspace of the fourth and fifth toes. The 4th toe is in a somewhat adductovarus position. There is no tenderness along the surgical sites. There is no swelling erythema, ascending cellulitis, flexions, crepitus or other signs of infection. A scars over on the dorsal aspect left midfoot from prior surgery. There is mild diffuse tenderness on the midfoot without any of edema, erythema, increase in warmth. No other areas of tenderness to bilateral lower arteries. No other open lesions or pre-ulcerative lesions.  No pain with calf compression, swelling, warmth, erythema.   Assessment: 44 year old female  status post left foot surgery, doing well  Plan: -Treatment options discussed including all alternatives, risks, and  complications -Sutures in the interspace were removed without complications. -At this time she she can start  to transition to regular shoe as tolerated. -Ice/elevation -Discussed orthotics. Made removal arch supports  -Can start exercising as tolerated.  -Pain medication as needed -Follow-up as scheduled or sooner if any problems arise. In the meantime, encouraged to call the office with any questions, concerns, change in symptoms.   Celesta Gentile, DPM

## 2015-12-06 ENCOUNTER — Ambulatory Visit: Payer: No Typology Code available for payment source | Admitting: Podiatry

## 2015-12-12 ENCOUNTER — Other Ambulatory Visit: Payer: Self-pay | Admitting: Family Medicine

## 2016-01-10 ENCOUNTER — Ambulatory Visit: Payer: No Typology Code available for payment source | Admitting: Podiatry

## 2016-05-13 ENCOUNTER — Encounter: Payer: Self-pay | Admitting: Obstetrics and Gynecology

## 2016-05-22 DIAGNOSIS — F419 Anxiety disorder, unspecified: Secondary | ICD-10-CM | POA: Insufficient documentation

## 2016-05-22 DIAGNOSIS — F329 Major depressive disorder, single episode, unspecified: Secondary | ICD-10-CM | POA: Insufficient documentation

## 2016-05-22 DIAGNOSIS — K219 Gastro-esophageal reflux disease without esophagitis: Secondary | ICD-10-CM | POA: Insufficient documentation

## 2016-05-22 DIAGNOSIS — R002 Palpitations: Secondary | ICD-10-CM | POA: Insufficient documentation

## 2016-05-26 ENCOUNTER — Ambulatory Visit (INDEPENDENT_AMBULATORY_CARE_PROVIDER_SITE_OTHER): Payer: 59 | Admitting: Family Medicine

## 2016-05-26 ENCOUNTER — Other Ambulatory Visit: Payer: Self-pay | Admitting: Family Medicine

## 2016-05-26 ENCOUNTER — Encounter: Payer: Self-pay | Admitting: Family Medicine

## 2016-05-26 VITALS — BP 120/70 | HR 73 | Temp 98.3°F | Resp 16 | Ht 64.0 in | Wt 173.0 lb

## 2016-05-26 DIAGNOSIS — E039 Hypothyroidism, unspecified: Secondary | ICD-10-CM

## 2016-05-26 DIAGNOSIS — K219 Gastro-esophageal reflux disease without esophagitis: Secondary | ICD-10-CM | POA: Diagnosis not present

## 2016-05-26 NOTE — Patient Instructions (Signed)
Start taking OTC Pepcid at bedtime in addition to omeprazole that you take at supper.

## 2016-05-26 NOTE — Progress Notes (Signed)
Patient: Briana Fox Female    DOB: 09/16/1971   45 y.o.   MRN: [id number] Visit Date: 05/26/2016  Today's Provider: Lelon Huh, MD   Chief Complaint  Patient presents with  . Follow-up  . Gastroesophageal Reflux  . Hypothyroidism   Subjective:    HPI  Follow-up for GERD from 05/11/2015; no changes. Continue omeprazole at night. States she wakes up at night a couple nights a month with severe heartburn. When this happens she will take an extra omeprazole so that symptoms will resolve after about a half hour. She does not eat within 3 hours of going to bed. Avoid large and spicy meals, and sleeps on two pillows.    Follow-up for hypothyroidism from 05/11/2015; labs checked, no changes.     No Known Allergies Previous Medications   BIOTIN 60454 MCG TBDP    Take 1 tablet by mouth daily.   CLONAZEPAM (KLONOPIN) 0.5 MG TABLET    Take 0.5 mg by mouth 2 (two) times daily.   DICLOFENAC SODIUM (VOLTAREN) 1 % GEL    Apply 2 g topically 4 (four) times daily. Rub into affected area of foot 2 to 4 times daily   GABAPENTIN (NEURONTIN) 300 MG CAPSULE    Take 300 mg by mouth daily.    LAMOTRIGINE (LAMICTAL) 150 MG TABLET       LEVOTHYROXINE (SYNTHROID, LEVOTHROID) 50 MCG TABLET    Take 50 mcg by mouth daily.    LITHIUM CARBONATE 150 MG CAPSULE    TAKE ONE CAPSULE EVERY DAY WITH 300 MG. CAPSULES   LITHIUM CARBONATE 300 MG CAPSULE    Take 300 mg by mouth 2 (two) times daily with a meal.    OMEPRAZOLE (PRILOSEC) 20 MG CAPSULE    TAKE ONE CAPSULE BY MOUTH EVERY DAY   OXYCODONE-ACETAMINOPHEN (PERCOCET) 10-325 MG PER TABLET    Take 1 tablet by mouth every 6 (six) hours as needed for pain.    Review of Systems  Constitutional: Negative for fever, chills, appetite change and fatigue.  Respiratory: Negative for chest tightness and shortness of breath.   Cardiovascular: Negative for chest pain and palpitations.  Gastrointestinal: Negative for nausea, vomiting and abdominal pain.    Neurological: Negative for dizziness and weakness.    Social History  Substance Use Topics  . Smoking status: Never Smoker   . Smokeless tobacco: Not on file  . Alcohol Use: 0.0 oz/week    0 Standard drinks or equivalent per week     Comment: occas   Objective:   BP 120/70 mmHg  Pulse 73  Temp(Src) 98.3 F (36.8 C) (Oral)  Resp 16  Ht 5\' 4"  (1.626 m)  Wt 173 lb (78.472 kg)  BMI 29.68 kg/m2  SpO2 97%  Physical Exam   General Appearance:    Alert, cooperative, no distress  Eyes:    PERRL, conjunctiva/corneas clear, EOM's intact       Lungs:     Clear to auscultation bilaterally, respirations unlabored  Heart:    Regular rate and rhythm  Neurologic:   Awake, alert, oriented x 3. No apparent focal neurological           defect.           Assessment & Plan:     1. Hypothyroidism, unspecified hypothyroidism type  - TSH  2. Gastroesophageal reflux disease, esophagitis presence not specified Usually well controlled, but occasional moderate to severe nocturnal symptoms. Reviewed reflux precautions, continue omeprazole. Add OTC  Pepcid AC QHS.        Lelon Huh, MD  [facility]

## 2016-05-27 LAB — TSH: TSH: 2.81 u[IU]/mL (ref 0.450–4.500)

## 2016-06-04 ENCOUNTER — Encounter: Payer: Self-pay | Admitting: Obstetrics and Gynecology

## 2016-06-16 IMAGING — CR DG CHEST 2V
1 series · 2 of 2 positions shown · non-contrast
Comparison: 04/16/2009

CLINICAL DATA: Cough, shortness of breath, wheezing, congestion
since [REDACTED].

EXAM:
CHEST  2 VIEW

[Series 1: kdxr chest pa (or ap) and lat · 0.14mm/px · 2 of 2 slices shown]
[im 1/2]
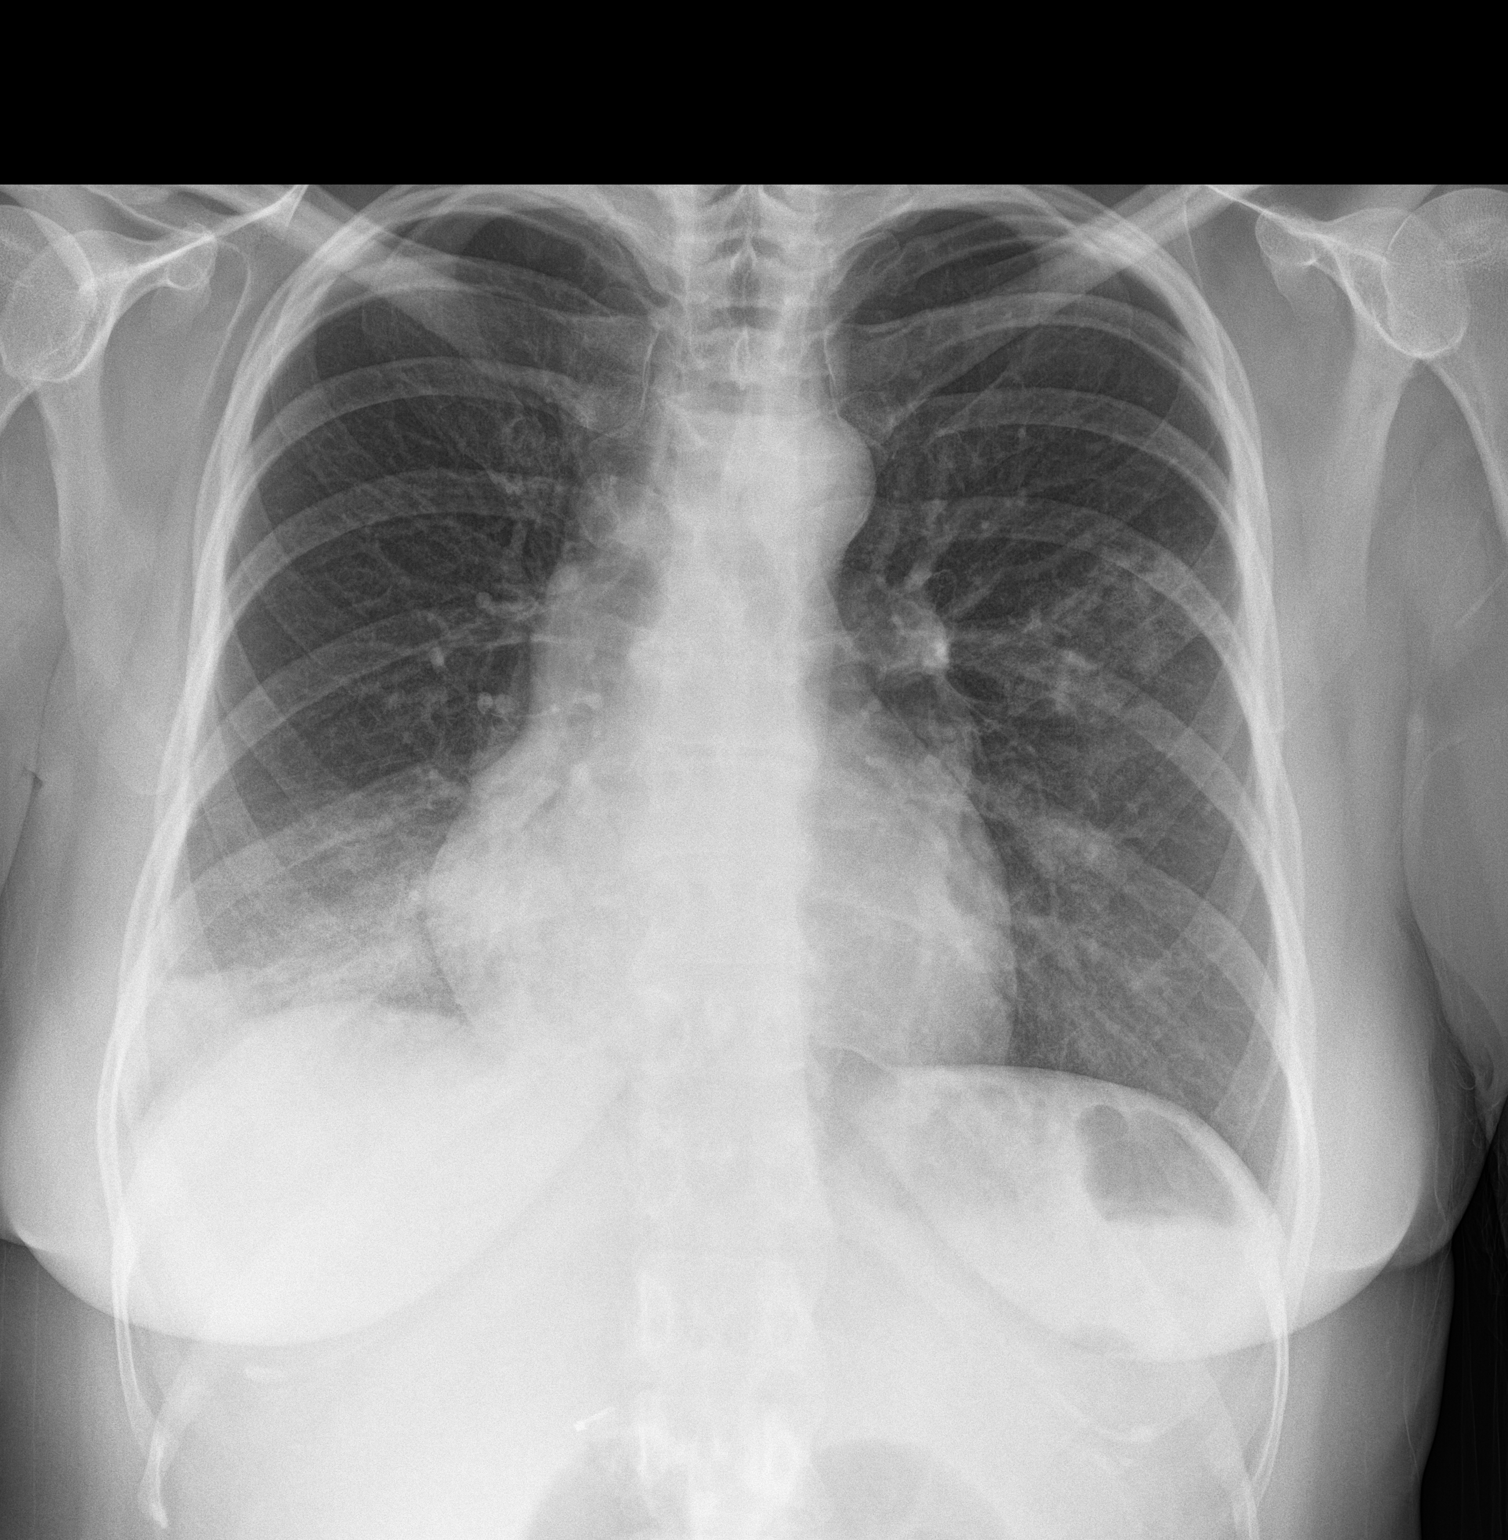
[im 2/2]
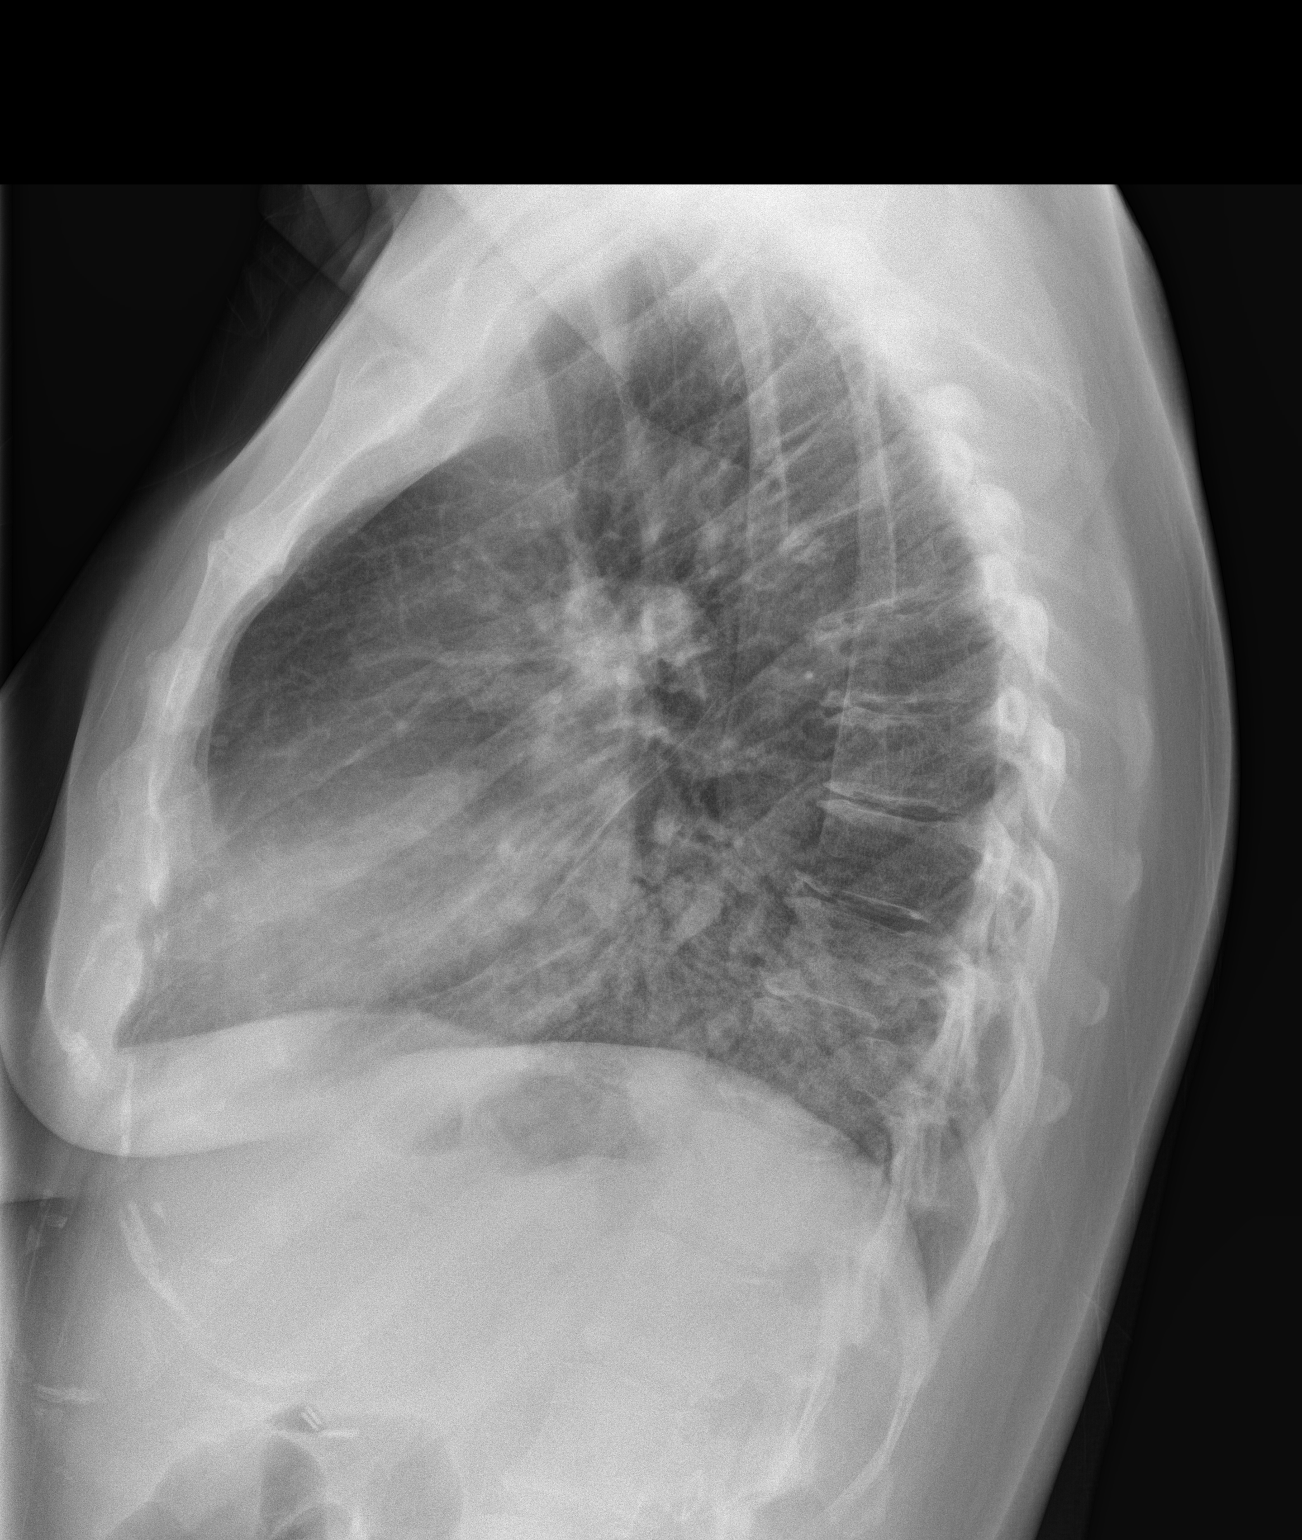

[2 of 2 positions shown; findings below may reference images not displayed]

FINDINGS: Airspace opacity in the right lower lobe compatible with pneumonia.
Patchy densities also present in the left upper lobe/ lingula also
likely pneumonia. Heart is normal size. No effusions. No acute bony
abnormality.
IMPRESSION: Right lower lobe and patchy left upper lobe multifocal pneumonia.

## 2016-06-17 ENCOUNTER — Encounter: Payer: Self-pay | Admitting: Obstetrics and Gynecology

## 2016-06-17 ENCOUNTER — Ambulatory Visit (INDEPENDENT_AMBULATORY_CARE_PROVIDER_SITE_OTHER): Payer: 59 | Admitting: Obstetrics and Gynecology

## 2016-06-17 VITALS — BP 147/73 | HR 92 | Wt 170.0 lb

## 2016-06-17 DIAGNOSIS — Z0000 Encounter for general adult medical examination without abnormal findings: Secondary | ICD-10-CM | POA: Diagnosis not present

## 2016-06-17 DIAGNOSIS — L659 Nonscarring hair loss, unspecified: Secondary | ICD-10-CM | POA: Diagnosis not present

## 2016-06-17 DIAGNOSIS — Z8742 Personal history of other diseases of the female genital tract: Secondary | ICD-10-CM | POA: Diagnosis not present

## 2016-06-17 DIAGNOSIS — Z1239 Encounter for other screening for malignant neoplasm of breast: Secondary | ICD-10-CM | POA: Diagnosis not present

## 2016-06-17 DIAGNOSIS — Z01419 Encounter for gynecological examination (general) (routine) without abnormal findings: Secondary | ICD-10-CM

## 2016-06-17 DIAGNOSIS — Z90710 Acquired absence of both cervix and uterus: Secondary | ICD-10-CM | POA: Diagnosis not present

## 2016-06-17 NOTE — Patient Instructions (Addendum)
1. No Pap smear is performed. 2. Mammogram is ordered. 3. Continue with healthy eating and exercise with weight loss. 4. Return in 1 year for annual exam 5. If hair loss continues, consider dermatology evaluation

## 2016-06-17 NOTE — Progress Notes (Signed)
Patient ID: Briana Fox, female   DOB: 11/04/1971, 45 y.o.   MRN: [id number] ANNUAL PREVENTATIVE CARE GYN  ENCOUNTER NOTE  Subjective:       Briana Fox is a 45 y.o. G11P3003 female here for a routine annual gynecologic exam.  Current complaints: 1.  ? Menopause- belly fat, hair falling out   Patient is status post transvaginal hysterectomy for endometriosis and fibroids; she is not experiencing any significant pelvic pain. Bowel and bladder function are normal.  Patient is eating protein shakes and bars. She does have one normal meal for supper. She has lost 6 pounds in the past year.  Gynecologic History No LMP recorded. Patient has had a hysterectomy. Contraception: status post hysterectomy Last Pap: noy needed. Results were: n/a Last mammogram: 2015. Results were: normal  Obstetric History OB History  Gravida Para Term Preterm AB SAB TAB Ectopic Multiple Living  3 3 3       3     # Outcome Date GA Lbr Len/2nd Weight Sex Delivery Anes PTL Lv  3 Term      Vag-Spont   Y  2 Term      Vag-Spont   Y  1 Term      Vag-Spont   Y      Past Medical History  Diagnosis Date  . RLS (restless legs syndrome)   . Thyroid disease   . Cardiac arrhythmia   . Gestational diabetes   . Endometriosis   . Overweight   . Bipolar 1 disorder ([place])   . Depression   . Anxiety   . GERD (gastroesophageal reflux disease)     Past Surgical History  Procedure Laterality Date  . Laparoscopy    . Bunionectomy    . Cholecystectomy    . Vaginal hysterectomy  08/1998    tvh  . Pulmonary function test  12/24/2010    Normal  . Foot surgery      Current Outpatient Prescriptions on File Prior to Visit  Medication Sig Dispense Refill  . Biotin 10000 MCG TBDP Take 1 tablet by mouth daily.    . clonazePAM (KLONOPIN) 0.5 MG tablet Take 0.5 mg by mouth 2 (two) times daily.  0  . gabapentin (NEURONTIN) 300 MG capsule Take 300 mg by mouth daily.   1  . lamoTRIgine (LAMICTAL) 150 MG tablet     . lithium  carbonate 150 MG capsule TAKE ONE CAPSULE EVERY DAY WITH 300 MG. CAPSULES  2  . lithium carbonate 300 MG capsule Take 300 mg by mouth 2 (two) times daily with a meal.   2  . omeprazole (PRILOSEC) 20 MG capsule TAKE ONE CAPSULE BY MOUTH EVERY DAY 60 capsule 3  . levothyroxine (SYNTHROID, LEVOTHROID) 50 MCG tablet Take 50 mcg by mouth daily.      No current facility-administered medications on file prior to visit.    No Known Allergies  Social History   Social History  . Marital Status: Married    Spouse Name: N/A  . Number of Children: 2  . Years of Education: N/A   Occupational History  .      Unemployed. 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 Smoking status: Never Smoker   . Smokeless tobacco: Not on file  . Alcohol Use: 0.0 oz/week    0 Standard drinks or equivalent per week     Comment: occas  . Drug Use: No  . Sexual Activity: Yes  Birth Control/ Protection: Surgical   Other Topics Concern  . Not on file   Social History Narrative    Family History  Problem Relation Age of Onset  . Heart disease Mother   . Rheum arthritis Mother   . Diabetes Father   . Endometriosis Sister   . Diabetes Sister   . Breast cancer Maternal Aunt   . Breast cancer Maternal Grandmother   . Colon cancer Neg Hx   . Ovarian cancer Neg Hx     The following portions of the patient's history were reviewed and updated as appropriate: allergies, current medications, past family history, past medical history, past social history, past surgical history and problem list.  Review of Systems ROS Review of Systems - General ROS: negative for - chills, fatigue, fever, hot flashes, night sweats, weight gain or weight loss Psychological ROS: negative for - anxiety, decreased libido, depression, mood swings, physical abuse or sexual abuse Ophthalmic ROS: negative for - blurry vision, eye pain or loss of vision ENT ROS: negative for - headaches, hearing  change, visual changes or vocal changes Allergy and Immunology ROS: negative for - hives, itchy/watery eyes or seasonal allergies Hematological and Lymphatic ROS: negative for - bleeding problems, bruising, swollen lymph nodes or weight loss Endocrine ROS: negative for - galactorrhea, hair pattern changes, hot flashes, malaise/lethargy, mood swings, palpitations, polydipsia/polyuria, skin changes, temperature intolerance or unexpected weight changes Breast ROS: negative for - new or changing breast lumps or nipple discharge Respiratory ROS: negative for - cough or shortness of breath Cardiovascular ROS: negative for - chest pain, irregular heartbeat, palpitations or shortness of breath Gastrointestinal ROS: no abdominal pain, change in bowel habits, or black or bloody stools Genito-Urinary ROS: no dysuria, trouble voiding, or hematuria Musculoskeletal ROS: negative for - joint pain or joint stiffness Neurological ROS: negative for - bowel and bladder control changes Dermatological ROS: negative for rash and skin lesion changes   Objective:   BP 147/73 mmHg  Pulse 92  Wt 170 lb (77.111 kg) CONSTITUTIONAL: Well-developed, well-nourished female in no acute distress.  PSYCHIATRIC: Normal mood and affect. Normal behavior. Normal judgment and thought content. [place]: Alert and oriented to person, place, and time. Normal muscle tone coordination. No cranial nerve deficit noted. HENT:  Normocephalic, atraumatic, External right and left ear normal. Oropharynx is clear and moist EYES: Conjunctivae and EOM are normal. No scleral icterus.  NECK: Normal range of motion, supple, no masses.  Normal thyroid.  SKIN: Skin is warm and dry. No rash noted. Not diaphoretic. No erythema. No pallor. CARDIOVASCULAR: Normal heart rate noted, regular rhythm, no murmur. RESPIRATORY: Clear to auscultation bilaterally. Effort and breath sounds normal, no problems with respiration noted. BREASTS: Symmetric in size.  No masses, skin changes, nipple drainage, or lymphadenopathy. ABDOMEN: Soft, normal bowel sounds, no distention noted.  No tenderness, rebound or guarding.  BLADDER: Normal PELVIC:  External Genitalia: Normal  BUS: Normal  Vagina: Normal  Cervix: Surgically absent  Uterus: Surgically absent  Adnexa: Normal  RV: External Exam NormaI, No Rectal Masses and Normal Sphincter tone  MUSCULOSKELETAL: Normal range of motion. No tenderness.  No cyanosis, clubbing, or edema.  2+ distal pulses. LYMPHATIC: No Axillary, Supraclavicular, or Inguinal Adenopathy.    Assessment:   Annual gynecologic examination 45 y.o. Contraception: status post hysterectomy status post TVH [id number]  History of endometriosis, asymptomatic at this time Hair loss  Plan:  Pap: Not needed Mammogram: Ordered Stool Guaiac Testing:  Not Indicated Labs: thru pcp Routine preventative health maintenance  measures emphasized: Exercise/Diet/Weight control, Tobacco Warnings, Alcohol/Substance use risks and Stress Management If hair loss continues, recommend dermatology referral Return to [place], [place]  Brayton Mars, MD  Note: This dictation was prepared with Dragon dictation along with smaller phrase technology. Any transcriptional errors that result from this process are unintentional.

## 2016-06-25 ENCOUNTER — Other Ambulatory Visit: Payer: Self-pay | Admitting: Family Medicine

## 2016-07-07 ENCOUNTER — Ambulatory Visit
Admission: RE | Admit: 2016-07-07 | Discharge: 2016-07-07 | Disposition: A | Discharge status: Home / Self Care | Payer: 59 | Source: Ambulatory Visit | Attending: Obstetrics and Gynecology | Admitting: Obstetrics and Gynecology

## 2016-07-07 DIAGNOSIS — Z1239 Encounter for other screening for malignant neoplasm of breast: Secondary | ICD-10-CM | POA: Diagnosis not present

## 2016-07-07 DIAGNOSIS — Z1231 Encounter for screening mammogram for malignant neoplasm of breast: Secondary | ICD-10-CM | POA: Insufficient documentation

## 2016-08-09 ENCOUNTER — Other Ambulatory Visit: Payer: Self-pay | Admitting: Family Medicine

## 2017-05-04 ENCOUNTER — Encounter: Payer: Self-pay | Admitting: Podiatry

## 2017-05-04 ENCOUNTER — Ambulatory Visit (INDEPENDENT_AMBULATORY_CARE_PROVIDER_SITE_OTHER): Payer: 59 | Admitting: Podiatry

## 2017-05-04 ENCOUNTER — Ambulatory Visit (INDEPENDENT_AMBULATORY_CARE_PROVIDER_SITE_OTHER): Payer: 59

## 2017-05-04 DIAGNOSIS — M76812 Anterior tibial syndrome, left leg: Secondary | ICD-10-CM

## 2017-05-04 DIAGNOSIS — M778 Other enthesopathies, not elsewhere classified: Secondary | ICD-10-CM

## 2017-05-04 DIAGNOSIS — M7752 Other enthesopathy of left foot: Secondary | ICD-10-CM | POA: Diagnosis not present

## 2017-05-04 DIAGNOSIS — D3610 Benign neoplasm of peripheral nerves and autonomic nervous system, unspecified: Secondary | ICD-10-CM

## 2017-05-04 DIAGNOSIS — M779 Enthesopathy, unspecified: Principal | ICD-10-CM

## 2017-05-08 DIAGNOSIS — Z79899 Other long term (current) drug therapy: Secondary | ICD-10-CM | POA: Diagnosis not present

## 2017-05-08 LAB — BASIC METABOLIC PANEL
BUN: 19 (ref 4–21)
Creatinine: 0.6 (ref <=1.1)
GLUCOSE: 65
Potassium: 4.8 (ref 3.4–5.3)
Sodium: 138 (ref 137–147)

## 2017-06-22 ENCOUNTER — Ambulatory Visit: Payer: 59 | Admitting: Podiatry

## 2017-06-23 ENCOUNTER — Encounter: Payer: 59 | Admitting: Obstetrics and Gynecology

## 2017-06-30 ENCOUNTER — Ambulatory Visit (INDEPENDENT_AMBULATORY_CARE_PROVIDER_SITE_OTHER): Payer: 59 | Admitting: Family Medicine

## 2017-06-30 ENCOUNTER — Encounter: Payer: Self-pay | Admitting: Family Medicine

## 2017-06-30 VITALS — BP 128/72 | HR 72 | Temp 98.4°F | Resp 16 | Wt 174.0 lb

## 2017-06-30 DIAGNOSIS — E039 Hypothyroidism, unspecified: Secondary | ICD-10-CM

## 2017-06-30 DIAGNOSIS — K219 Gastro-esophageal reflux disease without esophagitis: Secondary | ICD-10-CM

## 2017-06-30 MED ORDER — LEVOTHYROXINE SODIUM 50 MCG PO TABS: ORAL_TABLET | ORAL | 12 refills | Status: DC

## 2017-06-30 MED ORDER — OMEPRAZOLE 20 MG PO CPDR: 20.0000 mg | DELAYED_RELEASE_CAPSULE | Freq: Every day | ORAL | 12 refills | Status: DC

## 2017-06-30 NOTE — Progress Notes (Signed)
Patient: Briana Fox Female    DOB: 03-Aug-1971   46 y.o.   MRN: [id number] Visit Date: 06/30/2017  Today's Provider: Lelon Huh, MD   Chief Complaint  Patient presents with  . Hypothyroidism  . Gastroesophageal Reflux   Subjective:    HPI      Hypothyroid, follow-up:  TSH  Date Value Ref Range Status  05/26/2016 2.810 0.450 - 4.500 uIU/mL Final  05/11/2015 2.95 0.41 - 5.90 uIU/mL Final   Wt Readings from Last 3 Encounters:  06/30/17 174 lb (78.9 kg)  06/17/16 170 lb (77.1 kg)  05/26/16 173 lb (78.5 kg)    She was last seen for hypothyroid 1 years ago.  Management since that visit includes checking labs and continuing levothyroxine 50 mcg. She reports good compliance with treatment. She is not having side effects.  She is exercising. She is active "chasing" her grandchildren that she watches. She is experiencing nervousness and palpitations (pt states her anxiety is "acting up"; this is followed by psych.) She denies change in energy level, diarrhea, heat / cold intolerance and weight changes Weight trend: increasing steadily.  Pt reports her TSH was checked by her psychiatrist Briana Fox at [facility] in [place]) in the last 1-2 months. She believes this was normal, as she did not receive phone call advising her otherwise.  ------------------------------------------------------------------------  GERD, Follow up:  The patient was last seen for GERD 1 years ago. Changes made since that visit include adding Pepcid to Omeprazole; this was ineffective per pt. She needs a refill on her omeprazole. She IS experiencing abdominal bloating and no other sx; this is improving per pt.. She is NOT experiencing belching, chest pain, choking on food, cough, dysphagia, heartburn, midespigastric pain, nausea or need to clear throat frequently  ------------------------------------------------------------------------    No Known Allergies   Current Outpatient  Prescriptions:  .  clonazePAM (KLONOPIN) 0.5 MG tablet, Take 0.5 mg by mouth 2 (two) times daily., Disp: , Rfl: 0 .  gabapentin (NEURONTIN) 300 MG capsule, Take 300 mg by mouth daily. , Disp: , Rfl: 1 .  lamoTRIgine (LAMICTAL) 150 MG tablet, , Disp: , Rfl:  .  levothyroxine (SYNTHROID, LEVOTHROID) 50 MCG tablet, TAKE 1 TABLET BY MOUTH EVERY DAY ON AN EMPTY STOMACH 30-60 MINUTES BEFORE BREAKFAST, Disp: 30 tablet, Rfl: 12 .  lithium carbonate 150 MG capsule, TAKE ONE CAPSULE EVERY DAY WITH 300 MG. CAPSULES, Disp: , Rfl: 2 .  lithium carbonate 300 MG capsule, Take 300 mg by mouth 2 (two) times daily with a meal. , Disp: , Rfl: 2 .  omeprazole (PRILOSEC) 20 MG capsule, TAKE ONE CAPSULE BY MOUTH EVERY DAY, Disp: 60 capsule, Rfl: 6  Review of Systems  Constitutional: Negative for activity change, appetite change, chills, diaphoresis, fatigue, fever and unexpected weight change.  Respiratory: Negative for cough.   Cardiovascular: Positive for palpitations. Negative for chest pain and leg swelling.  Gastrointestinal: Negative for abdominal distention, abdominal pain, nausea and vomiting.  Endocrine: Negative for cold intolerance and heat intolerance.    Social History  Substance Use Topics  . Smoking status: Never Smoker  . Smokeless tobacco: Never Used  . Alcohol use 0.0 oz/week     Comment: occas   Objective:   BP 128/72 (BP Location: Left Arm, Patient Position: Sitting, Cuff Size: Normal)   Pulse 72   Temp 98.4 F (36.9 C) (Oral)   Resp 16   Wt 174 lb (78.9 kg)   BMI 29.87 kg/m  Physical Exam  General Appearance:    Alert, cooperative, no distress, obese  Eyes:    PERRL, conjunctiva/corneas clear, EOM's intact       Lungs:     Clear to auscultation bilaterally, respirations unlabored  Heart:    Regular rate and rhythm  Neurologic:   Awake, alert, oriented x 3. No apparent focal neurological           defect.           Assessment & Plan:     1. Gastroesophageal reflux  disease, esophagitis presence not specified Well controlled.  Continue current medications.  Refilled omeprazole x 1 year.   2. Hypothyroidism, unspecified type Reviewed results of thyroid functions recently checked by her psychiatrist which her normal.  Refilled x 1 year.       Briana Huh, MD  [facility]

## 2017-07-03 ENCOUNTER — Encounter: Payer: Self-pay | Admitting: Family Medicine

## 2017-07-10 ENCOUNTER — Other Ambulatory Visit: Payer: Self-pay | Admitting: Family Medicine

## 2017-07-15 NOTE — Progress Notes (Signed)
She presents today chief complaint of pain to the medial foot and plantar forefoot left. Status of having swelling on the inside of the foot and it feels like a palpable in the ball of my foot.  Objective: Pulses are palpable. She has pain on palpation to the third interdigital space of the left foot. She also has tenderness on palpation of the tibialis anterior tendon left.  Assessment: Radiographs do not demonstrate any type of a dressy 7 mellitus.  Plan: Offer an injection today to the third interdigital space

## 2017-08-24 ENCOUNTER — Other Ambulatory Visit: Payer: Self-pay | Admitting: Family Medicine

## 2017-09-01 NOTE — Progress Notes (Signed)
Patient Patient ID: Briana Fox, female   DOB: 10/13/1971, 46 y.o.   MRN: [id number] ANNUAL PREVENTATIVE CARE GYN  ENCOUNTER NOTE  Subjective:       Briana Fox is a 46 y.o. [id number] female here for a routine annual gynecologic exam.  Current complaints: 1. Groin on rt side- bump  Patient is status post transvaginal hysterectomy for endometriosis and fibroids; she is not experiencing any significant pelvic pain. Bowel and bladder function are normal.  The patient is reporting some hot flashes and night sweats. She is willing to go on a trial of ERT at this time   Gynecologic History No LMP recorded. Patient has had a hysterectomy. Contraception: status post hysterectomy Last Pap: not needed. Results were: n/a Last mammogram: 06/2016 birad 1 Results were: normal  Obstetric History OB History  Gravida Para Term Preterm AB Living  3 3 3     3   SAB TAB Ectopic Multiple Live Births          3    # Outcome Date GA Lbr Len/2nd Weight Sex Delivery Anes PTL Lv  3 Term      Vag-Spont   LIV  2 Term      Vag-Spont   LIV  1 Term      Vag-Spont   LIV      Past Medical History:  Diagnosis Date  . Bipolar 1 disorder ([place])   . Cardiac arrhythmia   . Depression   . Endometriosis   . Gestational diabetes   . Overweight   . RLS (restless legs syndrome)     Past Surgical History:  Procedure Laterality Date  . BUNIONECTOMY    . CHOLECYSTECTOMY    . FOOT SURGERY    . LAPAROSCOPY    . pulmonary function test  12/24/2010   Normal  . VAGINAL HYSTERECTOMY  08/1998   tvh    Current Outpatient Prescriptions on File Prior to Visit  Medication Sig Dispense Refill  . clonazePAM (KLONOPIN) 0.5 MG tablet Take 0.5 mg by mouth 2 (two) times daily.  0  . gabapentin (NEURONTIN) 300 MG capsule Take 300 mg by mouth daily.   1  . lamoTRIgine (LAMICTAL) 150 MG tablet     . levothyroxine (SYNTHROID, LEVOTHROID) 50 MCG tablet TAKE 1 TABLET BY MOUTH EVERY DAY ON AN EMPTY STOMACH 30-60 MINUTES BEFORE  BREAKFAST 30 tablet 11  . lithium carbonate 150 MG capsule TAKE ONE CAPSULE EVERY DAY WITH 300 MG. CAPSULES  2  . lithium carbonate 300 MG capsule Take 300 mg by mouth 2 (two) times daily with a meal.   2  . omeprazole (PRILOSEC) 20 MG capsule TAKE ONE CAPSULE BY MOUTH EVERY DAY 60 capsule 5   No current facility-administered medications on file prior to visit.     No Known Allergies  Social History   Social History  . Marital status: Married    Spouse name: N/A  . Number of children: 2  . Years of education: N/A   Occupational History  .      Unemployed. 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 Smoking status: Never Smoker  . Smokeless tobacco: Never Used  . Alcohol use 0.0 oz/week     Comment: occas  . Drug use: No  . Sexual activity: Yes    Birth control/ protection: Surgical   Other Topics Concern  . Not on file   Social History Narrative  . No  narrative on file    Family History  Problem Relation Age of Onset  . Heart disease Mother   . Rheum arthritis Mother   . Diabetes Father   . Endometriosis Sister   . Diabetes Sister   . Breast cancer Maternal Aunt   . Breast cancer Maternal Grandmother   . Colon cancer Neg Hx   . Ovarian cancer Neg Hx     The following portions of the patient's history were reviewed and updated as appropriate: allergies, current medications, past family history, past medical history, past social history, past surgical history and problem list.  Review of Systems Review of Systems  Constitutional: Negative.        Vasomotor symptoms mild to moderate  HENT: Negative.   Eyes: Negative.   Respiratory: Negative.   Cardiovascular: Negative.   Gastrointestinal: Negative.  Negative for abdominal pain.  Genitourinary:       Complaining of right vulvar nodule which tends  to come and go  Musculoskeletal: Negative.   Skin: Negative.   Neurological: Negative.   Endo/Heme/Allergies: Negative.    Psychiatric/Behavioral:       Bipolar condition stable     Objective:   BP 107/63   Pulse 74   Ht 5\' 4"  (1.626 m)   Wt 173 lb 4.8 oz (78.6 kg)   BMI 29.75 kg/m  CONSTITUTIONAL: Well-developed, well-nourished female in no acute distress.  PSYCHIATRIC: Normal mood and affect. Normal behavior. Normal judgment and thought content. [place]: Alert and oriented to person, place, and time. Normal muscle tone coordination. No cranial nerve deficit noted. HENT:  Normocephalic, atraumatic, External right and left ear normal. Oropharynx is clear and moist EYES: Conjunctivae and EOM are normal. No scleral icterus.  NECK: Normal range of motion, supple, no masses.  Normal thyroid.  SKIN: Skin is warm and dry. No rash noted. Not diaphoretic. No erythema. No pallor. CARDIOVASCULAR: Normal heart rate noted, regular rhythm, no murmur. RESPIRATORY: Clear to auscultation bilaterally. Effort and breath sounds normal, no problems with respiration noted. BREASTS: Symmetric in size. No masses, skin changes, nipple drainage, or lymphadenopathy. ABDOMEN: Soft, normal bowel sounds, no distention noted.  No tenderness, rebound or guarding.  BLADDER: Normal PELVIC:  External Genitalia: Right labia majora contains 5 mm healing ulceration/nodule consistent with suspected folliculitis; third does appear to be a 1/2 cm subcutaneous nodule in this location as well which is nontender  BUS: Normal  Vagina: Mild vaginal atrophy  Cervix: Surgically absent  Uterus: Surgically absent  Adnexa: Normal  RV: External Exam NormaI, No Rectal Masses and Normal Sphincter tone  MUSCULOSKELETAL: Normal range of motion. No tenderness.  No cyanosis, clubbing, or edema.  2+ distal pulses. LYMPHATIC: No Axillary, Supraclavicular, or Inguinal Adenopathy.    Assessment:   Annual gynecologic examination 46 y.o. Contraception: status post hysterectomy status post TVH [id number]  History of endometriosis, asymptomatic at this  time Folliculitis right labia majora Vaginal atrophy, mild  Plan:  Pap: Not needed Mammogram: Ordered Stool Guaiac Testing:  Not Indicated Labs: thru pcp Routine preventative health maintenance measures emphasized: Exercise/Diet/Weight control, Tobacco Warnings, Alcohol/Substance use risks and Stress Management  Sitz bath and or warm compresses to vulva once or twice a day; return if the folliculitis worsens Start Premarin 0.625 mg daily Return in 6 weeks for follow-up Return to [place], CMA  Brayton Mars, MD   Note: This dictation was prepared with Dragon dictation along with smaller phrase technology. Any transcriptional errors that result  from this process are unintentional.

## 2017-09-03 ENCOUNTER — Encounter: Payer: Self-pay | Admitting: Obstetrics and Gynecology

## 2017-09-03 ENCOUNTER — Ambulatory Visit (INDEPENDENT_AMBULATORY_CARE_PROVIDER_SITE_OTHER): Payer: 59 | Admitting: Obstetrics and Gynecology

## 2017-09-03 VITALS — BP 107/63 | HR 74 | Ht 64.0 in | Wt 173.3 lb

## 2017-09-03 DIAGNOSIS — N951 Menopausal and female climacteric states: Secondary | ICD-10-CM | POA: Diagnosis not present

## 2017-09-03 DIAGNOSIS — Z1239 Encounter for other screening for malignant neoplasm of breast: Secondary | ICD-10-CM

## 2017-09-03 DIAGNOSIS — L739 Follicular disorder, unspecified: Secondary | ICD-10-CM

## 2017-09-03 DIAGNOSIS — Z90710 Acquired absence of both cervix and uterus: Secondary | ICD-10-CM

## 2017-09-03 DIAGNOSIS — Z8742 Personal history of other diseases of the female genital tract: Secondary | ICD-10-CM | POA: Diagnosis not present

## 2017-09-03 DIAGNOSIS — Z1231 Encounter for screening mammogram for malignant neoplasm of breast: Secondary | ICD-10-CM

## 2017-09-03 DIAGNOSIS — N952 Postmenopausal atrophic vaginitis: Secondary | ICD-10-CM

## 2017-09-03 DIAGNOSIS — Z01419 Encounter for gynecological examination (general) (routine) without abnormal findings: Secondary | ICD-10-CM

## 2017-09-03 MED ORDER — ESTROGENS CONJUGATED 0.625 MG PO TABS: 0.6250 mg | ORAL_TABLET | Freq: Every day | ORAL | 6 refills | Status: DC

## 2017-09-03 NOTE — Patient Instructions (Signed)
1. No Pap smear needed 2. Mammogram ordered 3. Screening labs are to be performed through primary care 4. Recommend cyst baths once a day for vulvar folliculitis 5. Return in 6 weeks for follow-up on vasomotor symptoms 6. Begin Premarin 0.625 mg daily for ERT 7. Return in 1 year for annual exam   Health Maintenance for Postmenopausal Women Menopause is a normal process in which your reproductive ability comes to an end. This process happens gradually over a span of months to years, usually between the ages of 66 and 78. Menopause is complete when you have missed 12 consecutive menstrual periods. It is important to talk with your health care provider about some of the most common conditions that affect postmenopausal women, such as heart disease, cancer, and bone loss (osteoporosis). Adopting a healthy lifestyle and getting preventive care can help to promote your health and wellness. Those actions can also lower your chances of developing some of these common conditions. What should I know about menopause? During menopause, you may experience a number of symptoms, such as:  Moderate-to-severe hot flashes.  Night sweats.  Decrease in sex drive.  Mood swings.  Headaches.  Tiredness.  Irritability.  Memory problems.  Insomnia.  Choosing to treat or not to treat menopausal changes is an individual decision that you make with your health care provider. What should I know about hormone replacement therapy and supplements? Hormone therapy products are effective for treating symptoms that are associated with menopause, such as hot flashes and night sweats. Hormone replacement carries certain risks, especially as you become older. If you are thinking about using estrogen or estrogen with progestin treatments, discuss the benefits and risks with your health care provider. What should I know about heart disease and stroke? Heart disease, heart attack, and stroke become more likely as you  age. This may be due, in part, to the hormonal changes that your body experiences during menopause. These can affect how your body processes dietary fats, triglycerides, and cholesterol. Heart attack and stroke are both medical emergencies. There are many things that you can do to help prevent heart disease and stroke:  Have your blood pressure checked at least every 1-2 years. High blood pressure causes heart disease and increases the risk of stroke.  If you are 84-50 years old, ask your health care provider if you should take aspirin to prevent a heart attack or a stroke.  Do not use any tobacco products, including cigarettes, chewing tobacco, or electronic cigarettes. If you need help quitting, ask your health care provider.  It is important to eat a healthy diet and maintain a healthy weight. ? Be sure to include plenty of vegetables, fruits, low-fat dairy products, and lean protein. ? Avoid eating foods that are high in solid fats, added sugars, or salt (sodium).  Get regular exercise. This is one of the most important things that you can do for your health. ? Try to exercise for at least 150 minutes each week. The type of exercise that you do should increase your heart rate and make you sweat. This is known as moderate-intensity exercise. ? Try to do strengthening exercises at least twice each week. Do these in addition to the moderate-intensity exercise.  Know your numbers.Ask your health care provider to check your cholesterol and your blood glucose. Continue to have your blood tested as directed by your health care provider.  What should I know about cancer screening? There are several types of cancer. Take the following steps to  reduce your risk and to catch any cancer development as early as possible. Breast Cancer  Practice breast self-awareness. ? This means understanding how your breasts normally appear and feel. ? It also means doing regular breast self-exams. Let your health  care provider know about any changes, no matter how small.  If you are 85 or older, have a clinician do a breast exam (clinical breast exam or CBE) every year. Depending on your age, family history, and medical history, it may be recommended that you also have a yearly breast X-ray (mammogram).  If you have a family history of breast cancer, talk with your health care provider about genetic screening.  If you are at high risk for breast cancer, talk with your health care provider about having an MRI and a mammogram every year.  Breast cancer (BRCA) gene test is recommended for women who have family members with BRCA-related cancers. Results of the assessment will determine the need for genetic counseling and BRCA1 and for BRCA2 testing. BRCA-related cancers include these types: ? Breast. This occurs in males or females. ? Ovarian. ? Tubal. This may also be called fallopian tube cancer. ? Cancer of the abdominal or pelvic lining (peritoneal cancer). ? Prostate. ? Pancreatic.  Cervical, Uterine, and Ovarian Cancer Your health care provider may recommend that you be screened regularly for cancer of the pelvic organs. These include your ovaries, uterus, and vagina. This screening involves a pelvic exam, which includes checking for microscopic changes to the surface of your cervix (Pap test).  For women ages 21-65, health care providers may recommend a pelvic exam and a Pap test every three years. For women ages 51-65, they may recommend the Pap test and pelvic exam, combined with testing for human papilloma virus (HPV), every five years. Some types of HPV increase your risk of cervical cancer. Testing for HPV may also be done on women of any age who have unclear Pap test results.  Other health care providers may not recommend any screening for nonpregnant women who are considered low risk for pelvic cancer and have no symptoms. Ask your health care provider if a screening pelvic exam is right for  you.  If you have had past treatment for cervical cancer or a condition that could lead to cancer, you need Pap tests and screening for cancer for at least 20 years after your treatment. If Pap tests have been discontinued for you, your risk factors (such as having a new sexual partner) need to be reassessed to determine if you should start having screenings again. Some women have medical problems that increase the chance of getting cervical cancer. In these cases, your health care provider may recommend that you have screening and Pap tests more often.  If you have a family history of uterine cancer or ovarian cancer, talk with your health care provider about genetic screening.  If you have vaginal bleeding after reaching menopause, tell your health care provider.  There are currently no reliable tests available to screen for ovarian cancer.  Lung Cancer Lung cancer screening is recommended for adults 33-50 years old who are at high risk for lung cancer because of a history of smoking. A yearly low-dose CT scan of the lungs is recommended if you:  Currently smoke.  Have a history of at least 30 pack-years of smoking and you currently smoke or have quit within the past 15 years. A pack-year is smoking an average of one pack of cigarettes per day for one year.  Yearly screening should:  Continue until it has been 15 years since you quit.  Stop if you develop a health problem that would prevent you from having lung cancer treatment.  Colorectal Cancer  This type of cancer can be detected and can often be prevented.  Routine colorectal cancer screening usually begins at age 24 and continues through age 20.  If you have risk factors for colon cancer, your health care provider may recommend that you be screened at an earlier age.  If you have a family history of colorectal cancer, talk with your health care provider about genetic screening.  Your health care provider may also recommend  using home test kits to check for hidden blood in your stool.  A small camera at the end of a tube can be used to examine your colon directly (sigmoidoscopy or colonoscopy). This is done to check for the earliest forms of colorectal cancer.  Direct examination of the colon should be repeated every 5-10 years until age 81. However, if early forms of precancerous polyps or small growths are found or if you have a family history or genetic risk for colorectal cancer, you may need to be screened more often.  Skin Cancer  Check your skin from head to toe regularly.  Monitor any moles. Be sure to tell your health care provider: ? About any new moles or changes in moles, especially if there is a change in a mole's shape or color. ? If you have a mole that is larger than the size of a pencil eraser.  If any of your family members has a history of skin cancer, especially at a young age, talk with your health care provider about genetic screening.  Always use sunscreen. Apply sunscreen liberally and repeatedly throughout the day.  Whenever you are outside, protect yourself by wearing long sleeves, pants, a wide-brimmed hat, and sunglasses.  What should I know about osteoporosis? Osteoporosis is a condition in which bone destruction happens more quickly than new bone creation. After menopause, you may be at an increased risk for osteoporosis. To help prevent osteoporosis or the bone fractures that can happen because of osteoporosis, the following is recommended:  If you are 51-18 years old, get at least 1,000 mg of calcium and at least 600 mg of vitamin D per day.  If you are older than age 7 but younger than age 39, get at least 1,200 mg of calcium and at least 600 mg of vitamin D per day.  If you are older than age 34, get at least 1,200 mg of calcium and at least 800 mg of vitamin D per day.  Smoking and excessive alcohol intake increase the risk of osteoporosis. Eat foods that are rich in  calcium and vitamin D, and do weight-bearing exercises several times each week as directed by your health care provider. What should I know about how menopause affects my mental health? Depression may occur at any age, but it is more common as you become older. Common symptoms of depression include:  Low or sad mood.  Changes in sleep patterns.  Changes in appetite or eating patterns.  Feeling an overall lack of motivation or enjoyment of activities that you previously enjoyed.  Frequent crying spells.  Talk with your health care provider if you think that you are experiencing depression. What should I know about immunizations? It is important that you get and maintain your immunizations. These include:  Tetanus, diphtheria, and pertussis (Tdap) booster vaccine.  Influenza  every year before the flu season begins.  Pneumonia vaccine.  Shingles vaccine.  Your health care provider may also recommend other immunizations. This information is not intended to replace advice given to you by your health care provider. Make sure you discuss any questions you have with your health care provider. Document Released: 01/30/2006 Document Revised: 06/27/2016 Document Reviewed: 09/11/2015 Elsevier Interactive Patient Education  2018 [employer]  

## 2017-09-05 DIAGNOSIS — N952 Postmenopausal atrophic vaginitis: Secondary | ICD-10-CM | POA: Insufficient documentation

## 2017-09-05 DIAGNOSIS — N951 Menopausal and female climacteric states: Secondary | ICD-10-CM | POA: Insufficient documentation

## 2017-09-05 DIAGNOSIS — L739 Follicular disorder, unspecified: Secondary | ICD-10-CM | POA: Insufficient documentation

## 2017-10-14 ENCOUNTER — Encounter: Payer: 59 | Admitting: Obstetrics and Gynecology

## 2018-07-25 ENCOUNTER — Other Ambulatory Visit: Payer: Self-pay | Admitting: Family Medicine

## 2018-08-20 ENCOUNTER — Other Ambulatory Visit: Payer: Self-pay | Admitting: Family Medicine

## 2018-08-31 ENCOUNTER — Ambulatory Visit: Payer: 59 | Admitting: Family Medicine

## 2018-09-07 ENCOUNTER — Encounter: Payer: 59 | Admitting: Obstetrics and Gynecology

## 2018-09-13 ENCOUNTER — Encounter: Payer: Self-pay | Admitting: Family Medicine

## 2018-09-13 ENCOUNTER — Ambulatory Visit: Payer: 59 | Admitting: Family Medicine

## 2018-09-13 VITALS — BP 118/71 | HR 77 | Temp 98.3°F | Resp 16 | Wt 173.0 lb

## 2018-09-13 DIAGNOSIS — Z136 Encounter for screening for cardiovascular disorders: Secondary | ICD-10-CM | POA: Diagnosis not present

## 2018-09-13 DIAGNOSIS — E039 Hypothyroidism, unspecified: Secondary | ICD-10-CM | POA: Diagnosis not present

## 2018-09-13 DIAGNOSIS — K219 Gastro-esophageal reflux disease without esophagitis: Secondary | ICD-10-CM

## 2018-09-13 MED ORDER — OMEPRAZOLE 40 MG PO CPDR: 40.0000 mg | DELAYED_RELEASE_CAPSULE | Freq: Every day | ORAL | 5 refills | Status: DC

## 2018-09-13 NOTE — Patient Instructions (Addendum)
.   Please go to the lab draw station in [address] on the second floor of [facility]  . Please call the [facility] [phone]) to schedule a routine screening mammogram.

## 2018-09-13 NOTE — Progress Notes (Signed)
Patient: Briana Fox Female    DOB: 09-12-1971   47 y.o.   MRN: [id number] Visit Date: 09/13/2018  Today's Provider: Lelon Huh, MD   Chief Complaint  Patient presents with  . Hypothyroidism  . Gastroesophageal Reflux   Subjective:    HPI  Gastroesophageal reflux disease, esophagitis presence not specified From 06/30/2017-no changes. Patient reports good compliance with treatment, good tolerance and fair symptom control. Patient would like to have the dosage of Omeprazole increased due to worsening GERD at night which often wakes her up at night. She had previously taken it twice a day and reduced dose when she was doing well, but is nor flaring up more frequently. Denies any change in diet or eating habits.   Hypothyroidism, unspecified type From 01/04/2018-no changes. Patient reports good compliance with treatment, and good tolerance. Lab Results  Component Value Date   TSH 2.810 05/26/2016     No Known Allergies   Current Outpatient Medications:  .  clonazePAM (KLONOPIN) 0.5 MG tablet, Take 0.5 mg by mouth 2 (two) times daily., Disp: , Rfl: 0 .  gabapentin (NEURONTIN) 300 MG capsule, Take 300 mg by mouth daily. , Disp: , Rfl: 1 .  lamoTRIgine (LAMICTAL) 150 MG tablet, , Disp: , Rfl:  .  levothyroxine (SYNTHROID, LEVOTHROID) 50 MCG tablet, TAKE 1 TABLET BY MOUTH EVERY DAY ON AN EMPTY STOMACH 30-60 MINUTES BEFORE BREAKFAST, Disp: 30 tablet, Rfl: 1 .  lithium carbonate 150 MG capsule, TAKE ONE CAPSULE EVERY DAY WITH 300 MG. CAPSULES, Disp: , Rfl: 2 .  lithium carbonate 300 MG capsule, Take 300 mg by mouth 2 (two) times daily with a meal. , Disp: , Rfl: 2 .  omeprazole (PRILOSEC) 20 MG capsule, TAKE 1 CAPSULE BY MOUTH EVERY DAY, Disp: 30 capsule, Rfl: 1  Review of Systems  Constitutional: Negative for appetite change, chills, fatigue and fever.  Respiratory: Negative for chest tightness and shortness of breath.   Cardiovascular: Negative for chest pain and  palpitations.  Gastrointestinal: Negative for abdominal pain, nausea and vomiting.       Heartburn  Neurological: Negative for dizziness and weakness.    Social History   Tobacco Use  . Smoking status: Never Smoker  . Smokeless tobacco: Never Used  Substance Use Topics  . Alcohol use: Yes    Alcohol/week: 0.0 standard drinks    Comment: occas   Objective:   BP 118/71 (BP Location: Left Arm, Patient Position: Sitting, Cuff Size: Large)   Pulse 77   Temp 98.3 F (36.8 C) (Oral)   Resp 16   Wt 173 lb (78.5 kg)   SpO2 99% Comment: room air  BMI 29.70 kg/m  Vitals:   09/13/18 1634  BP: 118/71  Pulse: 77  Resp: 16  Temp: 98.3 F (36.8 C)  TempSrc: Oral  SpO2: 99%  Weight: 173 lb (78.5 kg)     Physical Exam   General Appearance:    Alert, cooperative, no distress  Eyes:    PERRL, conjunctiva/corneas clear, EOM's intact       Lungs:     Clear to auscultation bilaterally, respirations unlabored  Heart:    Regular rate and rhythm  Neurologic:   Awake, alert, oriented x 3. No apparent focal neurological           defect.           Assessment & Plan:     1. Hypothyroidism, unspecified type  - TSH - Comprehensive metabolic  panel  2. Gastroesophageal reflux disease, esophagitis presence not specified Increase omeprazole to 40mg  daily.  - Comprehensive metabolic panel - Lipid panel  3. Encounter for special screening examination for cardiovascular disorder  - Comprehensive metabolic panel - Lipid panel       Lelon Huh, MD  [facility]

## 2018-09-14 ENCOUNTER — Other Ambulatory Visit: Payer: Self-pay | Admitting: Family Medicine

## 2018-09-16 DIAGNOSIS — Z136 Encounter for screening for cardiovascular disorders: Secondary | ICD-10-CM | POA: Diagnosis not present

## 2018-09-16 DIAGNOSIS — E039 Hypothyroidism, unspecified: Secondary | ICD-10-CM | POA: Diagnosis not present

## 2018-09-16 DIAGNOSIS — K219 Gastro-esophageal reflux disease without esophagitis: Secondary | ICD-10-CM | POA: Diagnosis not present

## 2018-09-17 LAB — LIPID PANEL
CHOL/HDL RATIO: 3 ratio (ref 0.0–4.4)
Cholesterol, Total: 176 mg/dL (ref 100–199)
HDL: 58 mg/dL (ref >39)
LDL CALC: 105 mg/dL — AB (ref 0–99)
Triglycerides: 63 mg/dL (ref 0–149)
VLDL Cholesterol Cal: 13 mg/dL (ref 5–40)

## 2018-09-17 LAB — COMPREHENSIVE METABOLIC PANEL
ALBUMIN: 4.4 g/dL (ref 3.5–5.5)
ALT: 12 IU/L (ref 0–32)
AST: 17 IU/L (ref 0–40)
Albumin/Globulin Ratio: 2.2 (ref 1.2–2.2)
Alkaline Phosphatase: 38 IU/L — ABNORMAL LOW (ref 39–117)
BUN / CREAT RATIO: 26 — AB (ref 9–23)
BUN: 18 mg/dL (ref 6–24)
Bilirubin Total: 0.3 mg/dL (ref 0.0–1.2)
CO2: 23 mmol/L (ref 20–29)
CREATININE: 0.69 mg/dL (ref 0.57–1.00)
Calcium: 10.8 mg/dL — ABNORMAL HIGH (ref 8.7–10.2)
Chloride: 104 mmol/L (ref 96–106)
GFR, EST AFRICAN AMERICAN: 118 mL/min/{1.73_m2} (ref >59)
GFR, EST NON AFRICAN AMERICAN: 103 mL/min/{1.73_m2} (ref >59)
GLOBULIN, TOTAL: 2 g/dL (ref 1.5–4.5)
Glucose: 86 mg/dL (ref 65–99)
POTASSIUM: 4.4 mmol/L (ref 3.5–5.2)
Sodium: 141 mmol/L (ref 134–144)
TOTAL PROTEIN: 6.4 g/dL (ref 6.0–8.5)

## 2018-09-17 LAB — TSH: TSH: 4.42 u[IU]/mL (ref 0.450–4.500)

## 2018-09-21 ENCOUNTER — Telehealth: Payer: Self-pay | Admitting: Psychiatry

## 2018-09-21 MED ORDER — LAMOTRIGINE 150 MG PO TABS: 150.0000 mg | ORAL_TABLET | Freq: Two times a day (BID) | ORAL | 0 refills | Status: DC

## 2018-09-21 NOTE — Telephone Encounter (Signed)
Joseph Art has had trouble getting refill of Lamictal. [facility] has sent [place] a request 1 week ago. Not sure why not filled.

## 2018-09-21 NOTE — Telephone Encounter (Signed)
Pharmacy sent refill for lamictal patient aware.

## 2018-09-30 ENCOUNTER — Encounter: Payer: 59 | Admitting: Obstetrics and Gynecology

## 2018-11-09 ENCOUNTER — Encounter: Payer: Self-pay | Admitting: Emergency Medicine

## 2018-11-09 DIAGNOSIS — F319 Bipolar disorder, unspecified: Secondary | ICD-10-CM

## 2018-11-09 DIAGNOSIS — F4522 Body dysmorphic disorder: Secondary | ICD-10-CM

## 2018-11-10 ENCOUNTER — Ambulatory Visit: Payer: 59 | Admitting: Psychiatry

## 2018-11-10 ENCOUNTER — Encounter: Payer: Self-pay | Admitting: Psychiatry

## 2018-11-10 DIAGNOSIS — F411 Generalized anxiety disorder: Secondary | ICD-10-CM | POA: Diagnosis not present

## 2018-11-10 DIAGNOSIS — F5081 Binge eating disorder: Secondary | ICD-10-CM | POA: Diagnosis not present

## 2018-11-10 DIAGNOSIS — F319 Bipolar disorder, unspecified: Secondary | ICD-10-CM | POA: Diagnosis not present

## 2018-11-10 DIAGNOSIS — F4522 Body dysmorphic disorder: Secondary | ICD-10-CM

## 2018-11-10 MED ORDER — LISDEXAMFETAMINE DIMESYLATE 30 MG PO CAPS: 30.0000 mg | ORAL_CAPSULE | ORAL | 0 refills | Status: DC

## 2018-11-10 NOTE — Progress Notes (Signed)
Briana Fox [id number] Feb 15, 1971 47 y.o.  Subjective:   Patient ID:  Briana Fox is a 47 y.o. (DOB 12-03-1971) female.  Chief Complaint:  Chief Complaint  Patient presents with  . Anxiety  . Depression    HPI [name] presents to the office today for follow-up of depression and anxiety not good.  She thinks menopause and struggling with age bc turning 47 yo soon the age when her Mother died.  Sister claims M overdosed.  Patient doesn't know. [name] and describes anxiety as Moderate. Anxiety symptoms include: Excessive Worry, Panic Symptoms,. Pt reports no sleep issues. Pt reports that appetite is good. Pt reports that energy is poor and down slightly. Concentration is poor. Forgetful.  Suicidal thoughts:  denied by patient.  Put on weight bc binge eating chocolate DT depression.  Binge eating is out of control.  Has tried propranolol prn for anxiety and can tell a touch of difference at 40 mg/D.    Failed psychiatric medication trials include Trileptal, carbamazepine, SSRIs, bupropion, Vraylar, Rexulti, sertraline, Geodon, Latuda, Saphris, pramipexole.  She also refuses meds that have weight gain risk.  Review of Systems:  Review of Systems  Musculoskeletal: Positive for arthralgias.  Neurological: Negative for tremors and weakness.  Psychiatric/Behavioral: Positive for decreased concentration and dysphoric mood. Negative for agitation, behavioral problems, confusion, hallucinations, self-injury, sleep disturbance and suicidal ideas. The patient is nervous/anxious. The patient is not hyperactive.     Medications: I have reviewed the patient's current medications.  Current Outpatient Medications  Medication Sig Dispense Refill  . clonazePAM (KLONOPIN) 0.5 MG tablet Take 0.5 mg by mouth 2 (two) times daily.  0  . gabapentin (NEURONTIN) 300 MG capsule Take 300 mg by mouth 2 (two) times daily.   1  . lamoTRIgine (LAMICTAL) 150 MG tablet Take 1 tablet  (150 mg total) by mouth 2 (two) times daily. 180 tablet 0  . levothyroxine (SYNTHROID, LEVOTHROID) 50 MCG tablet TAKE 1 TABLET BY MOUTH EVERY DAY ON AN EMPTY STOMACH 30-60 MINUTES BEFORE BREAKFAST 30 tablet 12  . lithium carbonate 150 MG capsule TAKE ONE CAPSULE EVERY DAY WITH 300 MG. CAPSULES  2  . lithium carbonate 300 MG capsule Take 300 mg by mouth 2 (two) times daily with a meal.   2  . omeprazole (PRILOSEC) 40 MG capsule Take 1 capsule (40 mg total) by mouth daily. 30 capsule 5  . propranolol (INDERAL) 20 MG tablet Take 20 mg by mouth. 1 -2 po bid prn anxiety/irritability    . lisdexamfetamine (VYVANSE) 30 MG capsule Take 1 capsule (30 mg total) by mouth every morning. 30 capsule 0   No current facility-administered medications for this visit.     Medication Side Effects: None  Allergies: No Known Allergies  Past Medical History:  Diagnosis Date  . Bipolar 1 disorder ([place])   . Cardiac arrhythmia   . Depression   . Endometriosis   . Gestational diabetes   . Overweight   . RLS (restless legs syndrome)     Family History  Problem Relation Age of Onset  . Heart disease Mother   . Rheum arthritis Mother   . Diabetes Father   . Endometriosis Sister   . Diabetes Sister   . Breast cancer Maternal Aunt   . Breast cancer Maternal Grandmother   . Colon cancer Neg Hx   . Ovarian cancer Neg Hx     Social History   Socioeconomic History  . Marital  status: Married    Spouse name: Not on file  . Number of children: 2  . Years of education: Not on file  . 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 Financial resource strain: Not on file  . Food insecurity:    Worry: Not on file    Inability: Not on file  . Transportation needs:    Medical: Not on file    Non-medical: Not on file  Tobacco Use  . Smoking status: Never Smoker  . Smokeless tobacco: Never Used  Substance and  Sexual Activity  . Alcohol use: Yes    Alcohol/week: 0.0 standard drinks    Comment: occas  . Drug use: No  . Sexual activity: Yes    Birth control/protection: Surgical  Lifestyle  . Physical activity:    Days per week: Not on file    Minutes per session: Not on file  . Stress: Not on file  Relationships  . Social connections:    Talks on phone: Not on file    Gets together: Not on file    Attends religious service: Not on file    Active member of club or organization: Not on file    Attends meetings of clubs or organizations: Not on file    Relationship status: Not on file  . Intimate partner violence:    Fear of current or ex partner: Not on file    Emotionally abused: Not on file    Physically abused: Not on file    Forced sexual activity: Not on file  Other Topics Concern  . Not on file  Social History Narrative  . Not on file    Past Medical History, Surgical history, Social history, and Family history were reviewed and updated as appropriate.   Caffeine 6 cups/D bc I'm sleepy.  Please see review of systems for further details on the patient's review from today.   Objective:   Physical Exam:  There were no vitals taken for this visit.  Physical Exam  Lab Review:     Component Value Date/Time   NA 141 09/16/2018 0810   K 4.4 09/16/2018 0810   CL 104 09/16/2018 0810   CO2 23 09/16/2018 0810   GLUCOSE 86 09/16/2018 0810   BUN 18 09/16/2018 0810   CREATININE 0.69 09/16/2018 0810   CALCIUM 10.8 (H) 09/16/2018 0810   PROT 6.4 09/16/2018 0810   ALBUMIN 4.4 09/16/2018 0810   AST 17 09/16/2018 0810   ALT 12 09/16/2018 0810   ALKPHOS 38 (L) 09/16/2018 0810   BILITOT 0.3 09/16/2018 0810   GFRNONAA 103 09/16/2018 0810   GFRAA 118 09/16/2018 0810    No results found for: WBC, RBC, HGB, HCT, PLT, MCV, MCH, MCHC, RDW, LYMPHSABS, MONOABS, EOSABS, BASOSABS  No results found for: POCLITH, LITHIUM   No results found for: PHENYTOIN, PHENOBARB, VALPROATE, CBMZ    .res Assessment: Plan:    Bipolar depression ([place])  Generalized anxiety disorder  Body image disorder  Binge eating disorder - Plan: lisdexamfetamine (VYVANSE) 30 MG capsule   Greater than 50% of face to face time with patient was spent on counseling and coordination of care. 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  Failed multiple other meds.  Off label stimulant for depression may help and it certainly should help the binge eating which contributes to depression.  Vyvanse 30 Am. Disc SE. discussed the  risk of mania and mood cycling.  Let us know if any of those symptoms occur.  Will raise in 2 weeks if NR.  This appt was 30 mins.  FU 6 weeks.  Lynder Parents, MD, DFAPA  Please see After Visit Summary for patient specific instructions.  No future appointments.  No orders of the defined types were placed in this encounter.     -------------------------------

## 2018-11-15 ENCOUNTER — Other Ambulatory Visit: Payer: Self-pay | Admitting: Family Medicine

## 2018-11-15 ENCOUNTER — Other Ambulatory Visit: Payer: Self-pay

## 2018-11-15 DIAGNOSIS — Z1231 Encounter for screening mammogram for malignant neoplasm of breast: Principal | ICD-10-CM

## 2018-11-15 MED ORDER — LITHIUM CARBONATE 150 MG PO CAPS: 150.0000 mg | ORAL_CAPSULE | Freq: Every evening | ORAL | 1 refills | Status: DC

## 2018-11-15 NOTE — Telephone Encounter (Signed)
PA approved for Vyvanse 30mg  cap through 02/12/2019

## 2018-11-16 ENCOUNTER — Telehealth: Payer: Self-pay | Admitting: Psychiatry

## 2018-11-16 NOTE — Telephone Encounter (Signed)
Pt request

## 2018-11-16 NOTE — Telephone Encounter (Signed)
Briana Fox CALLED TO ASK IF YOU WOULD INCREASE THE DOSE OF VYVANSE THAT YOU PUT HER ON LAST WEEK?

## 2018-11-17 ENCOUNTER — Other Ambulatory Visit: Payer: Self-pay | Admitting: Psychiatry

## 2018-11-17 DIAGNOSIS — F5081 Binge eating disorder: Secondary | ICD-10-CM

## 2018-11-17 MED ORDER — LISDEXAMFETAMINE DIMESYLATE 40 MG PO CAPS: 40.0000 mg | ORAL_CAPSULE | ORAL | 0 refills | Status: DC

## 2018-11-17 NOTE — Progress Notes (Signed)
Ok increase after total of 2 weeks, ie 12/4 to 40 mg.

## 2018-12-01 ENCOUNTER — Other Ambulatory Visit: Payer: Self-pay

## 2018-12-01 MED ORDER — CLONAZEPAM 0.5 MG PO TABS: 0.5000 mg | ORAL_TABLET | Freq: Two times a day (BID) | ORAL | 0 refills | Status: DC

## 2018-12-02 ENCOUNTER — Other Ambulatory Visit: Payer: Self-pay | Admitting: Psychiatry

## 2018-12-29 ENCOUNTER — Ambulatory Visit
Admission: RE | Admit: 2018-12-29 | Discharge: 2018-12-29 | Disposition: A | Discharge status: Home / Self Care | Payer: 59 | Source: Ambulatory Visit | Attending: Family Medicine | Admitting: Family Medicine

## 2018-12-29 DIAGNOSIS — Z1231 Encounter for screening mammogram for malignant neoplasm of breast: Principal | ICD-10-CM

## 2019-01-06 ENCOUNTER — Encounter: Payer: Self-pay | Admitting: Obstetrics and Gynecology

## 2019-01-06 ENCOUNTER — Ambulatory Visit (INDEPENDENT_AMBULATORY_CARE_PROVIDER_SITE_OTHER): Payer: 59 | Admitting: Obstetrics and Gynecology

## 2019-01-06 VITALS — BP 125/65 | HR 72 | Ht 64.0 in | Wt 173.4 lb

## 2019-01-06 DIAGNOSIS — E663 Overweight: Secondary | ICD-10-CM

## 2019-01-06 DIAGNOSIS — R6882 Decreased libido: Secondary | ICD-10-CM

## 2019-01-06 DIAGNOSIS — Z01419 Encounter for gynecological examination (general) (routine) without abnormal findings: Secondary | ICD-10-CM

## 2019-01-06 DIAGNOSIS — N941 Unspecified dyspareunia: Secondary | ICD-10-CM | POA: Diagnosis not present

## 2019-01-06 DIAGNOSIS — N952 Postmenopausal atrophic vaginitis: Secondary | ICD-10-CM

## 2019-01-06 DIAGNOSIS — Z1239 Encounter for other screening for malignant neoplasm of breast: Secondary | ICD-10-CM | POA: Diagnosis not present

## 2019-01-06 DIAGNOSIS — Z1211 Encounter for screening for malignant neoplasm of colon: Secondary | ICD-10-CM

## 2019-01-06 NOTE — Patient Instructions (Signed)
Atrophic Vaginitis  Atrophic vaginitis is a condition in which the tissues that line the vagina become dry and thin. This condition is most common in women who have stopped having regular menstrual periods (are in menopause). This usually starts when a woman is 45-50 years old. That is the time when a woman's estrogen levels begin to drop (decrease). Estrogen is a female hormone. It helps to keep the tissues of the vagina moist. It stimulates the vagina to produce a clear fluid that lubricates the vagina for sexual intercourse. This fluid also protects the vagina from infection. Lack of estrogen can cause the lining of the vagina to get thinner and dryer. The vagina may also shrink in size. It may become less elastic. Atrophic vaginitis tends to get worse over time as a woman's estrogen level drops. What are the causes? This condition is caused by the normal drop in estrogen that happens around the time of menopause. What increases the risk? Certain conditions or situations may lower a woman's estrogen level, leading to a higher risk for atrophic vaginitis. You are more likely to develop this condition if:  You are taking medicines that block estrogen.  You have had your ovaries removed.  You are being treated for cancer with X-ray (radiation) or medicines (chemotherapy).  You have given birth or are breastfeeding.  You are older than age 50.  You smoke. What are the signs or symptoms? Symptoms of this condition include:  Pain, soreness, or bleeding during sexual intercourse (dyspareunia).  Vaginal burning, irritation, or itching.  Pain or bleeding when a speculum is used in a vaginal exam (pelvic exam).  Having burning pain when passing urine.  Vaginal discharge that is brown or yellow. In some cases, there are no symptoms. How is this diagnosed? This condition is diagnosed by taking a medical history and doing a physical exam. This will include a pelvic exam that checks the  vaginal tissues. Though rare, you may also have other tests, including:  A urine test.  A test that checks the acid balance in your vagina (acid balance test). How is this treated? Treatment for this condition depends on how severe your symptoms are. Treatment may include:  Using an over-the-counter vaginal lubricant before sex.  Using a long-acting vaginal moisturizer.  Using low-dose vaginal estrogen for moderate to severe symptoms that do not respond to other treatments. Options include creams, tablets, and inserts (vaginal rings). Before you use a vaginal estrogen, tell your health care provider if you have a history of: ? Breast cancer. ? Endometrial cancer. ? Blood clots. If you are not sexually active and your symptoms are very mild, you may not need treatment. Follow these instructions at home: Medicines  Take over-the-counter and prescription medicines only as told by your health care provider. Do not use herbal or alternative medicines unless your health care provider says that you can.  Use over-the-counter creams, lubricants, or moisturizers for dryness only as directed by your health care provider. General instructions  If your atrophic vaginitis is caused by menopause, discuss all of your menopause symptoms and treatment options with your health care provider.  Do not douche.  Do not use products that can make your vagina dry. These include: ? Scented feminine sprays. ? Scented tampons. ? Scented soaps.  Vaginal intercourse can help to improve blood flow and elasticity of vaginal tissue. If it hurts to have sex, try using a lubricant or moisturizer just before having intercourse. Contact a health care provider if:    Your discharge looks different than normal.  Your vagina has an unusual smell.  You have new symptoms.  Your symptoms do not improve with treatment.  Your symptoms get worse. Summary  Atrophic vaginitis is a condition in which the tissues that  line the vagina become dry and thin. It is most common in women who have stopped having regular menstrual periods (are in menopause).  Treatment options include using vaginal lubricants and low-dose vaginal estrogen.  Contact a health care provider if your vagina has an unusual smell, or if your symptoms get worse or do not improve after treatment. This information is not intended to replace advice given to you by your health care provider. Make sure you discuss any questions you have with your health care provider. Document Released: 04/24/2015 Document Revised: 09/03/2017 Document Reviewed: 09/03/2017 Elsevier Interactive Patient Education  2019 [place] Maintenance for Postmenopausal Women Menopause is a normal process in which your reproductive ability comes to an end. This process happens gradually over a span of months to years, usually between the ages of 71 and 48. Menopause is complete when you have missed 12 consecutive menstrual periods. It is important to talk with your health care provider about some of the most common conditions that affect postmenopausal women, such as heart disease, cancer, and bone loss (osteoporosis). Adopting a healthy lifestyle and getting preventive care can help to promote your health and wellness. Those actions can also lower your chances of developing some of these common conditions. What should I know about menopause? During menopause, you may experience a number of symptoms, such as:  Moderate-to-severe hot flashes.  Night sweats.  Decrease in sex drive.  Mood swings.  Headaches.  Tiredness.  Irritability.  Memory problems.  Insomnia. Choosing to treat or not to treat menopausal changes is an individual decision that you make with your health care provider. What should I know about hormone replacement therapy and supplements? Hormone therapy products are effective for treating symptoms that are associated with menopause,  such as hot flashes and night sweats. Hormone replacement carries certain risks, especially as you become older. If you are thinking about using estrogen or estrogen with progestin treatments, discuss the benefits and risks with your health care provider. What should I know about heart disease and stroke? Heart disease, heart attack, and stroke become more likely as you age. This may be due, in part, to the hormonal changes that your body experiences during menopause. These can affect how your body processes dietary fats, triglycerides, and cholesterol. Heart attack and stroke are both medical emergencies. There are many things that you can do to help prevent heart disease and stroke:  Have your blood pressure checked at least every 1-2 years. High blood pressure causes heart disease and increases the risk of stroke.  If you are 81-47 years old, ask your health care provider if you should take aspirin to prevent a heart attack or a stroke.  Do not use any tobacco products, including cigarettes, chewing tobacco, or electronic cigarettes. If you need help quitting, ask your health care provider.  It is important to eat a healthy diet and maintain a healthy weight. ? Be sure to include plenty of vegetables, fruits, low-fat dairy products, and lean protein. ? Avoid eating foods that are high in solid fats, added sugars, or salt (sodium).  Get regular exercise. This is one of the most important things that you can do for your health. ? Try to exercise for at  least 150 minutes each week. The type of exercise that you do should increase your heart rate and make you sweat. This is known as moderate-intensity exercise. ? Try to do strengthening exercises at least twice each week. Do these in addition to the moderate-intensity exercise.  Know your numbers.Ask your health care provider to check your cholesterol and your blood glucose. Continue to have your blood tested as directed by your health care  provider.  What should I know about cancer screening? There are several types of cancer. Take the following steps to reduce your risk and to catch any cancer development as early as possible. Breast Cancer  Practice breast self-awareness. ? This means understanding how your breasts normally appear and feel. ? It also means doing regular breast self-exams. Let your health care provider know about any changes, no matter how small.  If you are 39 or older, have a clinician do a breast exam (clinical breast exam or CBE) every year. Depending on your age, family history, and medical history, it may be recommended that you also have a yearly breast X-ray (mammogram).  If you have a family history of breast cancer, talk with your health care provider about genetic screening.  If you are at high risk for breast cancer, talk with your health care provider about having an MRI and a mammogram every year.  Breast cancer (BRCA) gene test is recommended for women who have family members with BRCA-related cancers. Results of the assessment will determine the need for genetic counseling and BRCA1 and for BRCA2 testing. BRCA-related cancers include these types: ? Breast. This occurs in males or females. ? Ovarian. ? Tubal. This may also be called fallopian tube cancer. ? Cancer of the abdominal or pelvic lining (peritoneal cancer). ? Prostate. ? Pancreatic. Cervical, Uterine, and Ovarian Cancer Your health care provider may recommend that you be screened regularly for cancer of the pelvic organs. These include your ovaries, uterus, and vagina. This screening involves a pelvic exam, which includes checking for microscopic changes to the surface of your cervix (Pap test).  For women ages 21-65, health care providers may recommend a pelvic exam and a Pap test every three years. For women ages 26-65, they may recommend the Pap test and pelvic exam, combined with testing for human papilloma virus (HPV), every  five years. Some types of HPV increase your risk of cervical cancer. Testing for HPV may also be done on women of any age who have unclear Pap test results.  Other health care providers may not recommend any screening for nonpregnant women who are considered low risk for pelvic cancer and have no symptoms. Ask your health care provider if a screening pelvic exam is right for you.  If you have had past treatment for cervical cancer or a condition that could lead to cancer, you need Pap tests and screening for cancer for at least 20 years after your treatment. If Pap tests have been discontinued for you, your risk factors (such as having a new sexual partner) need to be reassessed to determine if you should start having screenings again. Some women have medical problems that increase the chance of getting cervical cancer. In these cases, your health care provider may recommend that you have screening and Pap tests more often.  If you have a family history of uterine cancer or ovarian cancer, talk with your health care provider about genetic screening.  If you have vaginal bleeding after reaching menopause, tell your health care provider.  There are currently no reliable tests available to screen for ovarian cancer. Lung Cancer Lung cancer screening is recommended for adults 84-8 years old who are at high risk for lung cancer because of a history of smoking. A yearly low-dose CT scan of the lungs is recommended if you:  Currently smoke.  Have a history of at least 30 pack-years of smoking and you currently smoke or have quit within the past 15 years. A pack-year is smoking an average of one pack of cigarettes per day for one year. Yearly screening should:  Continue until it has been 15 years since you quit.  Stop if you develop a health problem that would prevent you from having lung cancer treatment. Colorectal Cancer  This type of cancer can be detected and can often be prevented.  Routine  colorectal cancer screening usually begins at age 14 and continues through age 42.  If you have risk factors for colon cancer, your health care provider may recommend that you be screened at an earlier age.  If you have a family history of colorectal cancer, talk with your health care provider about genetic screening.  Your health care provider may also recommend using home test kits to check for hidden blood in your stool.  A small camera at the end of a tube can be used to examine your colon directly (sigmoidoscopy or colonoscopy). This is done to check for the earliest forms of colorectal cancer.  Direct examination of the colon should be repeated every 5-10 years until age 27. However, if early forms of precancerous polyps or small growths are found or if you have a family history or genetic risk for colorectal cancer, you may need to be screened more often. Skin Cancer  Check your skin from head to toe regularly.  Monitor any moles. Be sure to tell your health care provider: ? About any new moles or changes in moles, especially if there is a change in a mole's shape or color. ? If you have a mole that is larger than the size of a pencil eraser.  If any of your family members has a history of skin cancer, especially at a young age, talk with your health care provider about genetic screening.  Always use sunscreen. Apply sunscreen liberally and repeatedly throughout the day.  Whenever you are outside, protect yourself by wearing long sleeves, pants, a wide-brimmed hat, and sunglasses. What should I know about osteoporosis? Osteoporosis is a condition in which bone destruction happens more quickly than new bone creation. After menopause, you may be at an increased risk for osteoporosis. To help prevent osteoporosis or the bone fractures that can happen because of osteoporosis, the following is recommended:  If you are 21-43 years old, get at least 1,000 mg of calcium and at least 600 mg  of vitamin D per day.  If you are older than age 54 but younger than age 57, get at least 1,200 mg of calcium and at least 600 mg of vitamin D per day.  If you are older than age 59, get at least 1,200 mg of calcium and at least 800 mg of vitamin D per day. Smoking and excessive alcohol intake increase the risk of osteoporosis. Eat foods that are rich in calcium and vitamin D, and do weight-bearing exercises several times each week as directed by your health care provider. What should I know about how menopause affects my mental health? Depression may occur at any age, but it is more common  as you become older. Common symptoms of depression include:  Low or sad mood.  Changes in sleep patterns.  Changes in appetite or eating patterns.  Feeling an overall lack of motivation or enjoyment of activities that you previously enjoyed.  Frequent crying spells. Talk with your health care provider if you think that you are experiencing depression. What should I know about immunizations? It is important that you get and maintain your immunizations. These include:  Tetanus, diphtheria, and pertussis (Tdap) booster vaccine.  Influenza every year before the flu season begins.  Pneumonia vaccine.  Shingles vaccine. Your health care provider may also recommend other immunizations. This information is not intended to replace advice given to you by your health care provider. Make sure you discuss any questions you have with your health care provider. Document Released: 01/30/2006 Document Revised: 06/27/2016 Document Reviewed: 09/11/2015 Elsevier Interactive Patient Education  2019 [employer].

## 2019-01-06 NOTE — Progress Notes (Signed)
PT is present today for her annual exam. Pt stated that she has been doing self-breast exam Pt stated that she is doing well and denies any issues. No problems or concerns.

## 2019-01-06 NOTE — Progress Notes (Signed)
ANNUAL PREVENTATIVE CARE GYNECOLOGY  ENCOUNTER NOTE  Subjective:       Briana Fox is a 48 y.o. [phone] menopausal female here for a routine annual gynecologic exam. The patient is sexually active. The patient is not taking hormone replacement therapy. Patient denies post-menopausal vaginal bleeding. The patient wears seatbelts: yes. The patient participates in regular exercise: no. Has the patient ever been transfused or tattooed?: no. The patient reports that there is not domestic violence in her life.  Current complaints: 1.  Patient complains of decreased sexual desire since the onset of menopause almost 2 years ago, gradually worsening.  She also has been experiencing painful intercourse over the past several months.  Notes that she and her partner don't have sex as often, but when they do it is uncomfortable. Reports that she is happy in her relationship, denies any marital discord. She notes that she is able to achieve orgasm if masturbating.  Notes that it just takes longer to reach climax and her husband is a little less patient. Has not initiated or changed any medications recently.    Gynecologic History No LMP recorded. Patient has had a hysterectomy. Contraception: post menopausal status Last Pap: 1999. Results were: normal Last mammogram: 06/2016. Results were: normal Last Colonoscopy: patient has never had a colonsocopy Last Dexa Scan: Patient has never had one   Obstetric History OB History  Gravida Para Term Preterm AB Living  3 3 3     3   SAB TAB Ectopic Multiple Live Births          3    # Outcome Date GA Lbr Len/2nd Weight Sex Delivery Anes PTL Lv  3 Term      Vag-Spont   LIV  2 Term      Vag-Spont   LIV  1 Term      Vag-Spont   LIV    Past Medical History:  Diagnosis Date  . Bipolar 1 disorder ([place])   . Cardiac arrhythmia   . Depression   . Endometriosis   . Gestational diabetes   . Overweight   . RLS (restless legs syndrome)     Family History    Problem Relation Age of Onset  . Heart disease Mother   . Rheum arthritis Mother   . Diabetes Father   . Endometriosis Sister   . Diabetes Sister   . Breast cancer Maternal Aunt   . Breast cancer Maternal Grandmother   . Colon cancer Neg Hx   . Ovarian cancer Neg Hx     Past Surgical History:  Procedure Laterality Date  . BUNIONECTOMY    . CHOLECYSTECTOMY    . FOOT SURGERY    . LAPAROSCOPY    . pulmonary function test  12/24/2010   Normal  . VAGINAL HYSTERECTOMY  08/1998   tv[facility]    Social History   Socioeconomic History  . Marital status: Married    Spouse name: Not on file  . Number of children: 2  . Years of education: Not on file  . 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 Financial resource strain: Not on file  . Food insecurity:    Worry: Not on file    Inability: Not on file  . Transportation needs:    Medical: Not on file    Non-medical: Not on file  Tobacco Use  . Smoking status: Never  Smoker  . Smokeless tobacco: Never Used  Substance and Sexual Activity  . Alcohol use: Yes    Alcohol/week: 0.0 standard drinks    Comment: occas  . Drug use: No  . Sexual activity: Yes    Birth control/protection: Surgical  Lifestyle  . Physical activity:    Days per week: Not on file    Minutes per session: Not on file  . Stress: Not on file  Relationships  . Social connections:    Talks on phone: Not on file    Gets together: Not on file    Attends religious service: Not on file    Active member of club or organization: Not on file    Attends meetings of clubs or organizations: Not on file    Relationship status: Not on file  . Intimate partner violence:    Fear of current or ex partner: Not on file    Emotionally abused: Not on file    Physically abused: Not on file    Forced sexual activity: Not on file  Other Topics Concern  . Not on file  Social  History Narrative  . Not on file    Current Outpatient Medications on File Prior to Visit  Medication Sig Dispense Refill  . clonazePAM (KLONOPIN) 0.5 MG tablet Take 1 tablet (0.5 mg total) by mouth 2 (two) times daily. 60 tablet 0  . gabapentin (NEURONTIN) 300 MG capsule Take 300 mg by mouth 2 (two) times daily.   1  . lamoTRIgine (LAMICTAL) 150 MG tablet TAKE 1 TABLET BY MOUTH TWICE A DAY 180 tablet 0  . levothyroxine (SYNTHROID, LEVOTHROID) 50 MCG tablet TAKE 1 TABLET BY MOUTH EVERY DAY ON AN EMPTY STOMACH 30-60 MINUTES BEFORE BREAKFAST 30 tablet 12  . lisdexamfetamine (VYVANSE) 40 MG capsule Take 1 capsule (40 mg total) by mouth every morning. 30 capsule 0  . lithium carbonate 150 MG capsule Take 1 capsule (150 mg total) by mouth every evening. 90 capsule 1  . lithium carbonate 300 MG capsule Take 300 mg by mouth 2 (two) times daily with a meal.   2  . omeprazole (PRILOSEC) 40 MG capsule Take 1 capsule (40 mg total) by mouth daily. 30 capsule 5  . propranolol (INDERAL) 20 MG tablet Take 20 mg by mouth. 1 -2 po bid prn anxiety/irritability     No current facility-administered medications on file prior to visit.     No Known Allergies    Review of Systems ROS Review of Systems - General ROS: negative for - chills, fatigue, fever, weight gain or weight loss. Hot flashes, night sweats (mild, occasional), Psychological ROS: negative for - anxiety, decreased libido, depression, mood swings, physical abuse or sexual abuse Ophthalmic ROS: negative for - blurry vision, eye pain or loss of vision ENT ROS: negative for - headaches, hearing change, visual changes or vocal changes Allergy and Immunology ROS: negative for - hives, itchy/watery eyes or seasonal allergies Hematological and Lymphatic ROS: negative for - bleeding problems, bruising, swollen lymph nodes or weight loss Endocrine ROS: negative for - galactorrhea, hair pattern changes, hot flashes, malaise/lethargy, mood swings,  palpitations, polydipsia/polyuria, skin changes, temperature intolerance or unexpected weight changes Breast ROS: negative for - new or changing breast lumps or nipple discharge Respiratory ROS: negative for - cough or shortness of breath Cardiovascular ROS: negative for - chest pain, irregular heartbeat, palpitations or shortness of breath Gastrointestinal ROS: no abdominal pain, change in bowel habits, or black or bloody stools Genito-Urinary ROS: no  dysuria, trouble voiding, or hematuria. Painful intercourse and decreased libido Musculoskeletal ROS: negative for - joint pain or joint stiffness Neurological ROS: negative for - bowel and bladder control changes Dermatological ROS: negative for rash and skin lesion changes   Objective:   BP 125/65   Pulse 72   Ht 5\' 4"  (1.626 m)   Wt 173 lb 6.4 oz (78.7 kg)   BMI 29.76 kg/m  CONSTITUTIONAL: Well-developed, well-nourished female in no acute distress. Overweight. PSYCHIATRIC: Normal mood and affect. Normal behavior. Normal judgment and thought content. [place]: Alert and oriented to person, place, and time. Normal muscle tone coordination. No cranial nerve deficit noted. HENT:  Normocephalic, atraumatic, External right and left ear normal. Oropharynx is clear and moist EYES: Conjunctivae and EOM are normal. Pupils are equal, round, and reactive to light. No scleral icterus.  NECK: Normal range of motion, supple, no masses.  Normal thyroid.  SKIN: Skin is warm and dry. No rash noted. Not diaphoretic. No erythema. No pallor. CARDIOVASCULAR: Normal heart rate noted, regular rhythm, no murmur. RESPIRATORY: Clear to auscultation bilaterally. Effort and breath sounds normal, no problems with respiration noted. BREASTS: Symmetric in size. No masses, skin changes, nipple drainage, or lymphadenopathy. ABDOMEN: Soft, normal bowel sounds, no distention noted.  No tenderness, rebound or guarding.  BLADDER: Normal PELVIC:  Bladder no bladder  distension noted  Urethra: normal appearing urethra with no masses, tenderness or lesions  Vulva: normal appearing vulva with no masses, tenderness or lesions  Vagina: normal appearing vagina with no discharge, no lesions and moderately atrophic  Cervix: surgically absent  Uterus: surgically absent, vaginal cuff well healed  Adnexa: normal adnexa in size, nontender and no masses  RV: External Exam NormaI, No Rectal Masses and Normal Sphincter tone  MUSCULOSKELETAL: Normal range of motion. No tenderness.  No cyanosis, clubbing, or edema.  2+ distal pulses. LYMPHATIC: No Axillary, Supraclavicular, or Inguinal Adenopathy.   Labs: No results found for: WBC, HGB, HCT, MCV, PLT  Lab Results  Component Value Date   CREATININE 0.69 09/16/2018   BUN 18 09/16/2018   NA 141 09/16/2018   K 4.4 09/16/2018   CL 104 09/16/2018   CO2 23 09/16/2018    Lab Results  Component Value Date   ALT 12 09/16/2018   AST 17 09/16/2018   ALKPHOS 38 (L) 09/16/2018   BILITOT 0.3 09/16/2018    Lab Results  Component Value Date   CHOL 176 09/16/2018   HDL 58 09/16/2018   LDLCALC 105 (H) 09/16/2018   TRIG 63 09/16/2018   CHOLHDL 3.0 09/16/2018    Lab Results  Component Value Date   TSH 4.420 09/16/2018    No results found for: HGBA1C   Assessment:   Encounter for well woman exam with routine gynecological exam Vaginal atrophy Dyspareunia in female Decreased libido Overweight  Plan:  - Pap: Not needed - Mammogram: Ordered - Stool Guaiac Testing:  Ordered colonoscopy.  - Labs: None ordered. Performed by PCP.  - Routine preventative health maintenance measures emphasized: Exercise/Diet/Weight control, Alcohol/Substance use risks and Stress Management.  - Discussed patient's vaginal atrophy, dyspareunia and low libido were likely due to menopausal state and estrogen deficiency. Patient also with mild vasomotor symptoms.  Discussed options for treatment, including HRT, vs non-hormonal  options for vasomotor symptoms plus local hormone therapy, vs vaginal moisturizers.  Patient declines systemic HRT.  Notes vasomotor symptoms are mild and manageable. Notes the dyspareunia is her most bothersome symptoms.  After counseling of all options, patient offered  trial of [place]. Given 1 month sample supply. To f/u in 1 month to reassess symptoms and prescribe medication if improvement noted.  - Return to [place], MD  [facility]

## 2019-01-09 ENCOUNTER — Encounter: Payer: Self-pay | Admitting: Obstetrics and Gynecology

## 2019-01-10 ENCOUNTER — Encounter: Payer: Self-pay | Admitting: Psychiatry

## 2019-01-10 ENCOUNTER — Ambulatory Visit: Payer: 59 | Admitting: Psychiatry

## 2019-01-10 VITALS — BP 147/59 | HR 72

## 2019-01-10 DIAGNOSIS — F4522 Body dysmorphic disorder: Secondary | ICD-10-CM | POA: Diagnosis not present

## 2019-01-10 DIAGNOSIS — F319 Bipolar disorder, unspecified: Secondary | ICD-10-CM | POA: Diagnosis not present

## 2019-01-10 DIAGNOSIS — F5081 Binge eating disorder: Secondary | ICD-10-CM | POA: Diagnosis not present

## 2019-01-10 DIAGNOSIS — F411 Generalized anxiety disorder: Secondary | ICD-10-CM

## 2019-01-10 MED ORDER — LISDEXAMFETAMINE DIMESYLATE 40 MG PO CAPS: 40.0000 mg | ORAL_CAPSULE | ORAL | 0 refills | Status: DC

## 2019-01-10 NOTE — Progress Notes (Signed)
Briana Fox [id number] 01-31-1971 48 y.o.  Subjective:   Patient ID:  Briana Fox is a 48 y.o. (DOB 03-16-1971) female.  Chief Complaint:  Chief Complaint  Patient presents with  . Follow-up    Medication management    HPI last seen November 20 Briana Fox presents to the office today for follow-up of depression and anxiety and binge eating disorder.    Doing better generally this time.  Vyvanse "miracle drug" with better focus and attention to grandsons better productivity.  Less binge eating.  But causes a lot of dry mouth and so can only take it 2-3 times weekly.  30mg  not effective.  40 mg effective.  Happier and less irritable.  Less yelling at grandson's.  Shocked at the difference.  She is failed or failed to tolerate multiple psychiatric medications and so at her last visit we elected to try a stimulant to treat both mood depressive symptoms and binge eating.  We will conservative with the dose starting at 30 mg because of the risk that could trigger manic episodes.  She called back and asked that it be increased to 40 mg which was done November 27.  not good.  She thinks menopause and struggling with age bc turning 48 yo soon the age when her Mother died.  Sister claims M overdosed.  Patient doesn't know. [name] and describes anxiety as Moderate. Anxiety symptoms include: Excessive Worry, Panic Symptoms,. Pt reports no sleep issues. Pt reports that appetite is good. Pt reports that energy is poor and down slightly. Concentration is poor. Forgetful.  Suicidal thoughts:  denied by patient.  Put on weight bc binge eating chocolate DT depression.  Binge eating is out of control.  Less on meds and less on weekends.  Has tried propranolol prn for anxiety and can tell a touch of difference at 40 mg/D.    Failed psychiatric medication trials include Trileptal, carbamazepine, SSRIs, bupropion, Vraylar, Rexulti, sertraline, Geodon, Latuda, Saphris, pramipexole.   She also refuses meds that have weight gain risk.  Review of Systems:  Review of Systems  Musculoskeletal: Positive for arthralgias.  Neurological: Negative for tremors and weakness.  Psychiatric/Behavioral: Positive for decreased concentration and dysphoric mood. Negative for agitation, behavioral problems, confusion, hallucinations, self-injury, sleep disturbance and suicidal ideas. The patient is nervous/anxious. The patient is not hyperactive.     Medications: I have reviewed the patient's current medications.  Current Outpatient Medications  Medication Sig Dispense Refill  . clonazePAM (KLONOPIN) 0.5 MG tablet Take 1 tablet (0.5 mg total) by mouth 2 (two) times daily. 60 tablet 0  . gabapentin (NEURONTIN) 300 MG capsule Take 300 mg by mouth 2 (two) times daily.   1  . lamoTRIgine (LAMICTAL) 150 MG tablet TAKE 1 TABLET BY MOUTH TWICE A DAY 180 tablet 0  . levothyroxine (SYNTHROID, LEVOTHROID) 50 MCG tablet TAKE 1 TABLET BY MOUTH EVERY DAY ON AN EMPTY STOMACH 30-60 MINUTES BEFORE BREAKFAST 30 tablet 12  . lisdexamfetamine (VYVANSE) 40 MG capsule Take 1 capsule (40 mg total) by mouth every morning. 30 capsule 0  . lithium carbonate 150 MG capsule Take 1 capsule (150 mg total) by mouth every evening. 90 capsule 1  . lithium carbonate 300 MG capsule Take 300 mg by mouth 2 (two) times daily with a meal.   2  . omeprazole (PRILOSEC) 40 MG capsule Take 1 capsule (40 mg total) by mouth daily. 30 capsule 5  . propranolol (INDERAL) 20 MG tablet  Take 20 mg by mouth. 1 -2 po bid prn anxiety/irritability    . [START ON 02/07/2019] lisdexamfetamine (VYVANSE) 40 MG capsule Take 1 capsule (40 mg total) by mouth every morning. 30 capsule 0  . [START ON 03/07/2019] lisdexamfetamine (VYVANSE) 40 MG capsule Take 1 capsule (40 mg total) by mouth every morning. 30 capsule 0   No current facility-administered medications for this visit.     Medication Side Effects: dry mouth.  Allergies: No Known  Allergies  Past Medical History:  Diagnosis Date  . Bipolar 1 disorder ([place])   . Cardiac arrhythmia   . Depression   . Endometriosis   . Gestational diabetes   . Overweight   . RLS (restless legs syndrome)     Family History  Problem Relation Age of Onset  . Heart disease Mother   . Rheum arthritis Mother   . Diabetes Father   . Endometriosis Sister   . Diabetes Sister   . Breast cancer Maternal Aunt   . Breast cancer Maternal Grandmother   . Colon cancer Neg Hx   . Ovarian cancer Neg Hx     Social History   Socioeconomic History  . Marital status: Married    Spouse name: Not on file  . Number of children: 2  . Years of education: Not on file  . 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 Financial resource strain: Not on file  . Food insecurity:    Worry: Not on file    Inability: Not on file  . Transportation needs:    Medical: Not on file    Non-medical: Not on file  Tobacco Use  . Smoking status: Never Smoker  . Smokeless tobacco: Never Used  Substance and Sexual Activity  . Alcohol use: Yes    Alcohol/week: 0.0 standard drinks    Comment: occas  . Drug use: No  . Sexual activity: Yes    Birth control/protection: Surgical  Lifestyle  . Physical activity:    Days per week: Not on file    Minutes per session: Not on file  . Stress: Not on file  Relationships  . Social connections:    Talks on phone: Not on file    Gets together: Not on file    Attends religious service: Not on file    Active member of club or organization: Not on file    Attends meetings of clubs or organizations: Not on file    Relationship status: Not on file  . Intimate partner violence:    Fear of current or ex partner: Not on file    Emotionally abused: Not on file    Physically abused: Not on file    Forced sexual activity: Not on file  Other Topics Concern  . Not on file   Social History Narrative  . Not on file    Past Medical History, Surgical history, Social history, and Family history were reviewed and updated as appropriate.   Caffeine 6 cups/D bc I'm sleepy.  Please see review of systems for further details on the patient's review from today.   Objective:   Physical Exam:  BP (!) 147/59 (BP Location: Left Arm)   Pulse 72   Physical Exam Constitutional:      General: She is not in acute distress.    Appearance: She is well-developed.  Musculoskeletal:  General: No deformity.  Neurological:     Mental Status: She is alert and oriented to person, place, and time.     Motor: No tremor.     Coordination: Coordination normal.     Gait: Gait normal.  Psychiatric:        Attention and Perception: Attention and perception normal.        Mood and Affect: Mood is not anxious or depressed. Affect is not labile, blunt, angry or inappropriate.        Speech: Speech normal.        Behavior: Behavior normal.        Thought Content: Thought content normal. Thought content does not include homicidal or suicidal ideation. Thought content does not include homicidal or suicidal plan.        Cognition and Memory: Cognition normal.        Judgment: Judgment normal.     Comments: Insight intact. No auditory or visual hallucinations. No delusions.      Lab Review:     Component Value Date/Time   NA 141 09/16/2018 0810   K 4.4 09/16/2018 0810   CL 104 09/16/2018 0810   CO2 23 09/16/2018 0810   GLUCOSE 86 09/16/2018 0810   BUN 18 09/16/2018 0810   CREATININE 0.69 09/16/2018 0810   CALCIUM 10.8 (H) 09/16/2018 0810   PROT 6.4 09/16/2018 0810   ALBUMIN 4.4 09/16/2018 0810   AST 17 09/16/2018 0810   ALT 12 09/16/2018 0810   ALKPHOS 38 (L) 09/16/2018 0810   BILITOT 0.3 09/16/2018 0810   GFRNONAA 103 09/16/2018 0810   GFRAA 118 09/16/2018 0810    No results found for: WBC, RBC, HGB, HCT, PLT, MCV, MCH, MCHC, RDW, LYMPHSABS, MONOABS, EOSABS,  BASOSABS  No results found for: POCLITH, LITHIUM   No results found for: PHENYTOIN, PHENOBARB, VALPROATE, CBMZ   .res Assessment: Plan:    Bipolar affective disorder, rapid cycling ([place])  Binge eating disorder - Plan: lisdexamfetamine (VYVANSE) 40 MG capsule, lisdexamfetamine (VYVANSE) 40 MG capsule, lisdexamfetamine (VYVANSE) 40 MG capsule  Generalized anxiety disorder  Body image disorder   Greater than 50% of face to face time with patient was spent on counseling and coordination of care. 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  Failed multiple other meds.  Off label stimulant for depression helped mood and  the binge eating which contributes to depression.  She also found herself more productive and more focused.  She may have underlying ADD that we have not previously diagnosed.  She is only able to take the medicine a few days per week but feels she does not really need it on the weekends.  This may help reduce her tendency to get tolerant to medications.  She has not had any manic symptoms as a result and in fact is felt calmer.  Vyvanse 40 AM.  Disc dry mouth and use biotene.    This appt was 30 mins.  FU 3 months  Lynder Parents, MD, DFAPA  Please see After Visit Summary for patient specific instructions.  Future Appointments  Date Time Provider [place]  02/10/2019  4:00 PM Rubie Maid, MD [facility] None  01/12/2020  3:00 PM Rubie Maid, MD [facility] None    No orders of the defined types were placed in this encounter.     -------------------------------

## 2019-01-17 ENCOUNTER — Encounter: Payer: Self-pay | Admitting: *Deleted

## 2019-02-10 ENCOUNTER — Encounter: Payer: 59 | Admitting: Obstetrics and Gynecology

## 2019-02-23 ENCOUNTER — Other Ambulatory Visit: Payer: Self-pay | Admitting: Family Medicine

## 2019-03-01 ENCOUNTER — Other Ambulatory Visit: Payer: Self-pay | Admitting: Psychiatry

## 2019-03-05 ENCOUNTER — Other Ambulatory Visit: Payer: Self-pay | Admitting: Psychiatry

## 2019-03-11 ENCOUNTER — Other Ambulatory Visit: Payer: Self-pay | Admitting: Psychiatry

## 2019-03-21 ENCOUNTER — Other Ambulatory Visit: Payer: Self-pay | Admitting: Psychiatry

## 2019-03-21 MED ORDER — LITHIUM CARBONATE 300 MG PO CAPS: 300.0000 mg | ORAL_CAPSULE | Freq: Two times a day (BID) | ORAL | 1 refills | Status: DC

## 2019-04-01 ENCOUNTER — Other Ambulatory Visit: Payer: Self-pay | Admitting: Psychiatry

## 2019-04-05 ENCOUNTER — Other Ambulatory Visit: Payer: Self-pay

## 2019-04-05 ENCOUNTER — Telehealth: Payer: Self-pay | Admitting: Psychiatry

## 2019-04-05 MED ORDER — GABAPENTIN 300 MG PO CAPS: ORAL_CAPSULE | ORAL | 2 refills | Status: DC

## 2019-04-05 NOTE — Telephone Encounter (Signed)
Patient called and said that she needs a refill of her gabapentin 300 mg she takes one at 5 pm and one at qhs. [facility] [address] in [place]. Next appt is 5/18

## 2019-04-05 NOTE — Telephone Encounter (Signed)
Refill submitted. 

## 2019-04-11 ENCOUNTER — Ambulatory Visit: Payer: 59 | Admitting: Physician Assistant

## 2019-04-28 ENCOUNTER — Other Ambulatory Visit: Payer: Self-pay | Admitting: Psychiatry

## 2019-05-09 ENCOUNTER — Ambulatory Visit (INDEPENDENT_AMBULATORY_CARE_PROVIDER_SITE_OTHER): Payer: BLUE CROSS/BLUE SHIELD | Admitting: Psychiatry

## 2019-05-09 ENCOUNTER — Other Ambulatory Visit: Payer: Self-pay

## 2019-05-09 ENCOUNTER — Encounter: Payer: Self-pay | Admitting: Psychiatry

## 2019-05-09 DIAGNOSIS — F411 Generalized anxiety disorder: Secondary | ICD-10-CM | POA: Diagnosis not present

## 2019-05-09 DIAGNOSIS — F4522 Body dysmorphic disorder: Secondary | ICD-10-CM

## 2019-05-09 DIAGNOSIS — F50819 Binge eating disorder, unspecified: Secondary | ICD-10-CM

## 2019-05-09 DIAGNOSIS — F319 Bipolar disorder, unspecified: Secondary | ICD-10-CM | POA: Diagnosis not present

## 2019-05-09 DIAGNOSIS — F5081 Binge eating disorder: Secondary | ICD-10-CM | POA: Diagnosis not present

## 2019-05-09 MED ORDER — METHYLPHENIDATE HCL ER (OSM) 36 MG PO TBCR: 36.0000 mg | EXTENDED_RELEASE_TABLET | Freq: Every day | ORAL | 0 refills | Status: DC

## 2019-05-09 NOTE — Progress Notes (Addendum)
Briana Fox [id number] 29-Dec-1970 48 y.o.  Virtual Visit via Telephone Note  I connected with pt by telephone and verified that I am speaking with the correct person using two identifiers.   I discussed the limitations, risks, security and privacy concerns of performing an evaluation and management service by telephone and the availability of in person appointments. I also discussed with the patient that there may be a patient responsible charge related to this service. The patient expressed understanding and agreed to proceed.  I discussed the assessment and treatment plan with the patient. The patient was provided an opportunity to ask questions and all were answered. The patient agreed with the plan and demonstrated an understanding of the instructions.   The patient was advised to call back or seek an in-person evaluation if the symptoms worsen or if the condition fails to improve as anticipated.  I provided 30 minutes of non-face-to-face time during this encounter. The call started at 5 and ended at 530. The patient was located at home and the provider was located office.  Subjective:   Patient ID:  Briana Fox is a 48 y.o. (DOB [birth year]) female.  Chief Complaint:  Chief Complaint  Patient presents with  . Follow-up    Medication Management  . Depression    Increased  . Anxiety    Increased    Depression         Associated symptoms include decreased concentration.  Associated symptoms include no suicidal ideas.  Past medical history includes anxiety.   Anxiety  Symptoms include decreased concentration and nervous/anxious behavior. Patient reports no confusion or suicidal ideas.      Briana Fox presents to the office today for follow-up of depression and anxiety and binge eating disorder.    Seen January 10, 2019.  She had recently seen improvement in mood and binge eating with Vyvanse 40 mg daily.  So we did not change any further medications at that time.  Had to  quit Vyvanse bc dry mouth.  Increase depression and anxiety since Covid.  Used to dance weekends and that helped the depression.  Worse with eating bc is at home keeping grandson.  Easily stressed with changes.  Afraid of Covid too.    She is failed or failed to tolerate multiple psychiatric medications and so at her last visit we elected to try a stimulant to treat both mood depressive symptoms and binge eating.  We will conservative with the dose starting at 30 mg because of the risk that could trigger manic episodes.  She called back and asked that it be increased to 40 mg which was done November 27.  not good.  She thinks menopause and struggling with age bc turning 48 yo soon the age when her Mother died.  Sister claims Briana Fox.  Patient doesn't know. [name] and describes anxiety as Moderate. Anxiety symptoms include: Excessive Worry, Panic Symptoms,. Pt reports no sleep issues. Pt reports that appetite is good. Pt reports that energy is poor and down slightly. Concentration is poor. Forgetful.  Suicidal thoughts:  denied by patient.  Put on weight bc binge eating chocolate DT depression.  Binge eating is out of control.  Less on meds and less on weekends.  Has tried propranolol prn for anxiety and can tell a touch of difference at 40 mg/D.    Failed psychiatric medication trials include Trileptal, carbamazepine, SSRIs, bupropion, Vraylar, Rexulti, sertraline, Geodon, Latuda, Saphris, pramipexole, Vyvanse 40 milligrams effective  but dry mouth.  She also refuses meds that have weight gain risk.    Review of Systems:  Review of Systems  Musculoskeletal: Positive for arthralgias.  Neurological: Negative for tremors and weakness.  Psychiatric/Behavioral: Positive for decreased concentration, depression and dysphoric mood. Negative for agitation, behavioral problems, confusion, hallucinations, self-injury, sleep disturbance and suicidal ideas. The patient is  nervous/anxious. The patient is not hyperactive.     Medications: I have reviewed the patient's current medications.  Current Outpatient Medications  Medication Sig Dispense Refill  . clonazePAM (KLONOPIN) 0.5 MG tablet TAKE 1 TABLET BY MOUTH TWICE A DAY AS NEEDED 60 tablet 0  . gabapentin (NEURONTIN) 300 MG capsule TAKE 1 CAPSULE BY MOUTH AT 5PM AND 1 CAPSULE AT BEDTIME 180 capsule 0  . lamoTRIgine (LAMICTAL) 150 MG tablet TAKE 1 TABLET BY MOUTH TWICE A DAY 180 tablet 0  . levothyroxine (SYNTHROID, LEVOTHROID) 50 MCG tablet TAKE 1 TABLET BY MOUTH EVERY DAY ON AN EMPTY STOMACH 30-60 MINUTES BEFORE BREAKFAST 30 tablet 12  . lithium carbonate 150 MG capsule TAKE 1 CAPSULE (150 MG TOTAL) BY MOUTH EVERY EVENING. 90 capsule 0  . lithium carbonate 300 MG capsule Take 1 capsule (300 mg total) by mouth 2 (two) times daily with a meal. 180 capsule 1  . omeprazole (PRILOSEC) 40 MG capsule TAKE 1 CAPSULE BY MOUTH EVERY DAY 90 capsule 1  . methylphenidate (CONCERTA) 36 MG PO CR tablet Take 1 tablet (36 mg total) by mouth daily. 30 tablet 0   No current facility-administered medications for this visit.     Medication Side Effects: dry mouth.  Allergies: No Known Allergies  Past Medical History:  Diagnosis Date  . Bipolar 1 disorder ([place])   . Cardiac arrhythmia   . Depression   . Endometriosis   . Gestational diabetes   . Overweight   . RLS (restless legs syndrome)     Family History  Problem Relation Age of Onset  . Heart disease Mother   . Rheum arthritis Mother   . Diabetes Father   . Endometriosis Sister   . Diabetes Sister   . Breast cancer Maternal Aunt   . Breast cancer Maternal Grandmother   . Colon cancer Neg Hx   . Ovarian cancer Neg Hx     Social History   Socioeconomic History  . Marital status: Married    Spouse name: Not on file  . Number of children: 2  . Years of education: Not on file  . 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 Financial resource strain: Not on file  . Food insecurity:    Worry: Not on file    Inability: Not on file  . Transportation needs:    Medical: Not on file    Non-medical: Not on file  Tobacco Use  . Smoking status: Never Smoker  . Smokeless tobacco: Never Used  Substance and Sexual Activity  . Alcohol use: Yes    Alcohol/week: 0.0 standard drinks    Comment: occas  . Drug use: No  . Sexual activity: Yes    Birth control/protection: Surgical  Lifestyle  . Physical activity:    Days per week: Not on file    Minutes per session: Not on file  . Stress: Not on file  Relationships  . Social connections:    Talks on phone: Not on file    Gets  together: Not on file    Attends religious service: Not on file    Active member of club or organization: Not on file    Attends meetings of clubs or organizations: Not on file    Relationship status: Not on file  . Intimate partner violence:    Fear of current or ex partner: Not on file    Emotionally abused: Not on file    Physically abused: Not on file    Forced sexual activity: Not on file  Other Topics Concern  . Not on file  Social History Narrative  . Not on file    Past Medical History, Surgical history, Social history, and Family history were reviewed and updated as appropriate.   Caffeine 6 cups/D bc I'Briana sleepy.  Please see review of systems for further details on the patient's review from today.   Objective:   Physical Exam:  There were no vitals taken for this visit.  Physical Exam Neurological:     Mental Status: She is alert and oriented to person, place, and time.     Cranial Nerves: No dysarthria.  Psychiatric:        Attention and Perception: Attention normal. She does not perceive auditory hallucinations.        Mood and Affect: Mood is anxious and depressed.        Speech: Speech normal.        Behavior: Behavior is cooperative.         Thought Content: Thought content normal. Thought content is not paranoid or delusional. Thought content does not include homicidal or suicidal ideation. Thought content does not include homicidal or suicidal plan.        Cognition and Memory: Cognition and memory normal.        Judgment: Judgment normal.     Comments: Insight fair.      Lab Review:     Component Value Date/Time   NA 141 09/16/2018 0810   K 4.4 09/16/2018 0810   CL 104 09/16/2018 0810   CO2 23 09/16/2018 0810   GLUCOSE 86 09/16/2018 0810   BUN 18 09/16/2018 0810   CREATININE 0.69 09/16/2018 0810   CALCIUM 10.8 (H) 09/16/2018 0810   PROT 6.4 09/16/2018 0810   ALBUMIN 4.4 09/16/2018 0810   AST 17 09/16/2018 0810   ALT 12 09/16/2018 0810   ALKPHOS 38 (L) 09/16/2018 0810   BILITOT 0.3 09/16/2018 0810   GFRNONAA 103 09/16/2018 0810   GFRAA 118 09/16/2018 0810    No results found for: WBC, RBC, HGB, HCT, PLT, MCV, MCH, MCHC, RDW, LYMPHSABS, MONOABS, EOSABS, BASOSABS  No results found for: POCLITH, LITHIUM   No results found for: PHENYTOIN, PHENOBARB, VALPROATE, CBMZ   .res Assessment: Plan:    Bipolar affective disorder, rapid cycling ([place])  Binge eating disorder  Generalized anxiety disorder  Body image disorder   Greater than 50% of face to face time with patient was spent on counseling and coordination of care. 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  Failed multiple other meds.  Off label stimulant Vyvanse for depression helped mood and  the binge eating which contributes to depression.  She also found herself more productive and more focused.  She may have underlying ADD that we have not previously diagnosed.   She has not had any manic symptoms as a result and in fact is felt calmer.  DC Vyvanse bc SE.  Disc dry mouth and use biotene.  Because of the marked improvement in  mood and productivity and the reduction in binge eating and improvement in concentration that she got out of the Vyvanse it seems  reasonable to try an alternative stimulant unrelated to Vyvanse.  The most similar option would be Concerta 36 mg daily.  She was informed that this is also a stimulant may cause the same dry mouth on the other hand it is a different molecule and may cause less.  She wants to try it.  Discussed the risk of manic symptoms and to call us if she has any such symptoms.  We will not try to separately address her anxiety symptoms at this time.  She has been very med sensitive and difficult to treat and therefore we should only change 1 medication at a time.  This appt was 30 mins.  FU 3 months  Lynder Parents, MD, DFAPA  Please see After Visit Summary for patient specific instructions.  Future Appointments  Date Time Provider [place]  01/12/2020  3:00 PM Rubie Maid, MD [facility] None    No orders of the defined types were placed in this encounter.     -------------------------------

## 2019-05-10 ENCOUNTER — Other Ambulatory Visit: Payer: Self-pay

## 2019-05-10 MED ORDER — CONCERTA 36 MG PO TBCR: 36.0000 mg | EXTENDED_RELEASE_TABLET | Freq: Every day | ORAL | 0 refills | Status: DC

## 2019-05-13 ENCOUNTER — Telehealth: Payer: Self-pay | Admitting: Psychiatry

## 2019-05-13 ENCOUNTER — Other Ambulatory Visit: Payer: Self-pay | Admitting: Psychiatry

## 2019-05-13 DIAGNOSIS — F319 Bipolar disorder, unspecified: Secondary | ICD-10-CM

## 2019-05-13 DIAGNOSIS — F5081 Binge eating disorder: Secondary | ICD-10-CM

## 2019-05-13 MED ORDER — METHYLPHENIDATE HCL ER 20 MG PO TBCR: 20.0000 mg | EXTENDED_RELEASE_TABLET | Freq: Every day | ORAL | 0 refills | Status: DC

## 2019-05-13 NOTE — Progress Notes (Signed)
Patient cannot afford Concerta trial.  Switch to Ritalin-SR 20 mg am.

## 2019-05-13 NOTE — Telephone Encounter (Signed)
Patient called and said that insurance is requiring her to try the brand name of concerta this cost 017 dollars. She can't afford that and doesn't want to paay that much if it doesn't work. So can you prescribe something different for her to try. [phone]

## 2019-06-01 ENCOUNTER — Other Ambulatory Visit: Payer: Self-pay | Admitting: Psychiatry

## 2019-06-27 ENCOUNTER — Other Ambulatory Visit: Payer: Self-pay | Admitting: Psychiatry

## 2019-07-24 ENCOUNTER — Other Ambulatory Visit: Payer: Self-pay | Admitting: Psychiatry

## 2019-07-25 ENCOUNTER — Encounter: Payer: Self-pay | Admitting: Psychiatry

## 2019-07-25 ENCOUNTER — Ambulatory Visit (INDEPENDENT_AMBULATORY_CARE_PROVIDER_SITE_OTHER): Payer: BC Managed Care – PPO | Admitting: Psychiatry

## 2019-07-25 DIAGNOSIS — F902 Attention-deficit hyperactivity disorder, combined type: Secondary | ICD-10-CM | POA: Diagnosis not present

## 2019-07-25 DIAGNOSIS — F5081 Binge eating disorder: Secondary | ICD-10-CM | POA: Diagnosis not present

## 2019-07-25 DIAGNOSIS — F411 Generalized anxiety disorder: Secondary | ICD-10-CM | POA: Diagnosis not present

## 2019-07-25 DIAGNOSIS — F319 Bipolar disorder, unspecified: Secondary | ICD-10-CM | POA: Diagnosis not present

## 2019-07-25 DIAGNOSIS — F4522 Body dysmorphic disorder: Secondary | ICD-10-CM

## 2019-07-25 MED ORDER — METHYLPHENIDATE HCL ER 20 MG PO TBCR: 40.0000 mg | EXTENDED_RELEASE_TABLET | Freq: Every day | ORAL | 0 refills | Status: DC

## 2019-07-25 NOTE — Progress Notes (Signed)
Briana Fox [id number] 10/03/1971 48 y.o.  Virtual Visit via Telephone Note  I connected with pt by telephone and verified that I am speaking with the correct person using two identifiers.   I discussed the limitations, risks, security and privacy concerns of performing an evaluation and management service by telephone and the availability of in person appointments. I also discussed with the patient that there may be a patient responsible charge related to this service. The patient expressed understanding and agreed to proceed.  I discussed the assessment and treatment plan with the patient. The patient was provided an opportunity to ask questions and all were answered. The patient agreed with the plan and demonstrated an understanding of the instructions.   The patient was advised to call back or seek an in-person evaluation if the symptoms worsen or if the condition fails to improve as anticipated.  I provided 30 minutes of non-face-to-face time during this encounter. The call started at 435 and ended at 58. The patient was located at home and the provider was located office.  Subjective:   Patient ID:  Briana Fox is a 48 y.o. (DOB 09-28-1971) female.  Chief Complaint:  Chief Complaint  Patient presents with  . Follow-up    Medication Management  . Depression    Medication Management    Depression        Associated symptoms include decreased concentration.  Associated symptoms include no suicidal ideas.  Past medical history includes anxiety.   Anxiety Symptoms include decreased concentration and nervous/anxious behavior. Patient reports no confusion or suicidal ideas.      [name] presents to the office today for follow-up of depression and anxiety and binge eating disorder.    Last seen May 09, 2019.  She had marked improvement in the past with regard to mood and binge eating with Vyvanse but could not tolerate the dry mouth and discontinued it.  So at  the last appointment we prescribed Concerta as the next best most similar long-acting stimulant option.  However she called back stating she cannot afford that 1.  Therefore Ritalin-SR 20 mg 1 every morning was sent in.  I tried it and it didn't work.  I think it needs to be increased but didn't want to spend another $40.  No dry mouth with it either.  She's open to taking it at a higher dosage.  Mood still with ups and downs but not severe.  Some depression..  She thinks menopause and struggling with age bc turning 48 yo soon the age when her Mother died.  Sister claims M overdosed.  Patient doesn't know. [name] and describes anxiety as Moderate. Anxiety symptoms include: Excessive Worry, Panic Symptoms,. Fearful of Covid and restricts herself.  Pt reports no sleep issues. Pt reports that appetite is good. Pt reports that energy is poor and down slightly. Concentration is poor. Forgetful.  Suicidal thoughts:  denied by patient.  Clonazepam 0.5 mg 1/4 tablet in the morning and 1/2 tablet at night.  Can't fully stop am doses.  Put on weight bc binge eating chocolate DT depression.  Binge eating is out of control.  Less on meds and less on weekends.  Has tried propranolol prn for anxiety and can tell a touch of difference at 40 mg/D.    Failed psychiatric medication trials include Trileptal, carbamazepine, SSRIs, bupropion, Vraylar, Rexulti, sertraline, Geodon, Latuda, Saphris, pramipexole, Vyvanse 40 milligrams effective but dry mouth.  She also refuses meds  that have weight gain risk.    Review of Systems:  Review of Systems  Musculoskeletal: Positive for arthralgias.  Neurological: Negative for tremors and weakness.  Psychiatric/Behavioral: Positive for decreased concentration, depression and dysphoric mood. Negative for agitation, behavioral problems, confusion, hallucinations, self-injury, sleep disturbance and suicidal ideas. The patient is nervous/anxious. The  patient is not hyperactive.     Medications: I have reviewed the patient's current medications.  Current Outpatient Medications  Medication Sig Dispense Refill  . clonazePAM (KLONOPIN) 0.5 MG tablet TAKE 1 TABLET BY MOUTH TWICE A DAY AS NEEDED 60 tablet 0  . gabapentin (NEURONTIN) 300 MG capsule TAKE 1 CAPSULE BY MOUTH AT 5PM AND 1 CAPSULE AT BEDTIME 180 capsule 0  . lamoTRIgine (LAMICTAL) 150 MG tablet TAKE 1 TABLET BY MOUTH TWICE A DAY 180 tablet 0  . levothyroxine (SYNTHROID, LEVOTHROID) 50 MCG tablet TAKE 1 TABLET BY MOUTH EVERY DAY ON AN EMPTY STOMACH 30-60 MINUTES BEFORE BREAKFAST 30 tablet 12  . lithium carbonate 150 MG capsule TAKE 1 CAPSULE (150 MG TOTAL) BY MOUTH EVERY EVENING. 90 capsule 0  . lithium carbonate 300 MG capsule Take 1 capsule (300 mg total) by mouth 2 (two) times daily with a meal. 180 capsule 1  . omeprazole (PRILOSEC) 40 MG capsule TAKE 1 CAPSULE BY MOUTH EVERY DAY 90 capsule 1  . methylphenidate (METADATE ER) 20 MG ER tablet Take 2 tablets (40 mg total) by mouth daily. 60 tablet 0   No current facility-administered medications for this visit.     Medication Side Effects: dry mouth.  Allergies: No Known Allergies  Past Medical History:  Diagnosis Date  . Bipolar 1 disorder ([place])   . Cardiac arrhythmia   . Depression   . Endometriosis   . Gestational diabetes   . Overweight   . RLS (restless legs syndrome)     Family History  Problem Relation Age of Onset  . Heart disease Mother   . Rheum arthritis Mother   . Diabetes Father   . Endometriosis Sister   . Diabetes Sister   . Breast cancer Maternal Aunt   . Breast cancer Maternal Grandmother   . Colon cancer Neg Hx   . Ovarian cancer Neg Hx     Social History   Socioeconomic History  . Marital status: Married    Spouse name: Not on file  . Number of children: 2  . Years of education: Not on file  . 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 Financial resource strain: Not on file  . Food insecurity    Worry: Not on file    Inability: Not on file  . Transportation needs    Medical: Not on file    Non-medical: Not on file  Tobacco Use  . Smoking status: Never Smoker  . Smokeless tobacco: Never Used  Substance and Sexual Activity  . Alcohol use: Yes    Alcohol/week: 0.0 standard drinks    Comment: occas  . Drug use: No  . Sexual activity: Yes    Birth control/protection: Surgical  Lifestyle  . Physical activity    Days per week: Not on file    Minutes per session: Not on file  . Stress: Not on file  Relationships  . Social Herbalist on phone: Not on file    Gets together: Not on file    Attends  religious service: Not on file    Active member of club or organization: Not on file    Attends meetings of clubs or organizations: Not on file    Relationship status: Not on file  . Intimate partner violence    Fear of current or ex partner: Not on file    Emotionally abused: Not on file    Physically abused: Not on file    Forced sexual activity: Not on file  Other Topics Concern  . Not on file  Social History Narrative  . Not on file    Past Medical History, Surgical history, Social history, and Family history were reviewed and updated as appropriate.   Caffeine 6 cups/D bc I'm sleepy.  Please see review of systems for further details on the patient's review from today.   Objective:   Physical Exam:  There were no vitals taken for this visit.  Physical Exam Neurological:     Mental Status: She is alert and oriented to person, place, and time.     Cranial Nerves: No dysarthria.  Psychiatric:        Attention and Perception: Attention normal. She does not perceive auditory hallucinations.        Mood and Affect: Mood is anxious and depressed.        Speech: Speech normal.        Behavior: Behavior is cooperative.        Thought Content:  Thought content normal. Thought content is not paranoid or delusional. Thought content does not include homicidal or suicidal ideation. Thought content does not include homicidal or suicidal plan.        Cognition and Memory: Cognition and memory normal.        Judgment: Judgment normal.     Comments: Insight fair.      Lab Review:     Component Value Date/Time   NA 141 09/16/2018 0810   K 4.4 09/16/2018 0810   CL 104 09/16/2018 0810   CO2 23 09/16/2018 0810   GLUCOSE 86 09/16/2018 0810   BUN 18 09/16/2018 0810   CREATININE 0.69 09/16/2018 0810   CALCIUM 10.8 (H) 09/16/2018 0810   PROT 6.4 09/16/2018 0810   ALBUMIN 4.4 09/16/2018 0810   AST 17 09/16/2018 0810   ALT 12 09/16/2018 0810   ALKPHOS 38 (L) 09/16/2018 0810   BILITOT 0.3 09/16/2018 0810   GFRNONAA 103 09/16/2018 0810   GFRAA 118 09/16/2018 0810    No results found for: WBC, RBC, HGB, HCT, PLT, MCV, MCH, MCHC, RDW, LYMPHSABS, MONOABS, EOSABS, BASOSABS  No results found for: POCLITH, LITHIUM   No results found for: PHENYTOIN, PHENOBARB, VALPROATE, CBMZ   .res Assessment: Plan:    Mikala was seen today for follow-up and depression.  Diagnoses and all orders for this visit:  Attention deficit hyperactivity disorder (ADHD), combined type -     methylphenidate (METADATE ER) 20 MG ER tablet; Take 2 tablets (40 mg total) by mouth daily.  Binge eating disorder -     methylphenidate (METADATE ER) 20 MG ER tablet; Take 2 tablets (40 mg total) by mouth daily.  Bipolar affective disorder, rapid cycling ([facility])  Generalized anxiety disorder  Body image disorder  Greater than 50% of face to face time with patient was spent on counseling and coordination of care. 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  Failed multiple other meds.  Off label stimulant Vyvanse for depression helped mood and  the binge eating which contributes to depression.  She also found herself more productive and more focused.  She may have underlying  ADD that we have not previously diagnosed.   She has not had any manic symptoms as a result and in fact is felt calmer. However she stopped the Vyvanse because she felt like she could not tolerate the dry mouth.  She saw no particular effect from Ritalin-SR 20 mg 1 every morning.  Would still prefer to use long-acting methylphenidate if possible.  Will increase Ritalin to SR 20 mg 2 every morning.  If necessary could consider short acting stimulant. Discussed potential benefits, risks, and side effects of stimulants with patient to include increased heart rate, palpitations, insomnia, increased anxiety, increased irritability, or decreased appetite.  Instructed patient to contact office if experiencing any significant tolerability issues.  Discussed that it is not preferable to use combination of benzodiazepine plus stimulant and the same patient at the same time.  However this patient has been extremely difficult to treat.  She is taking a very low-dose of clonazepam.  It seems reasonable in this situation to make an exception.  We discussed the short-term risks associated with benzodiazepines including sedation and increased fall risk among others.  Discussed long-term side effect risk including dependence, potential withdrawal symptoms, and the potential eventual dose-related risk of dementia.  We will not try to separately address her anxiety symptoms at this time.  She has been very med sensitive and difficult to treat and therefore we should only change 1 medication at a time.  Overall her bipolar symptoms are not extreme.  She has chronic mild rapid cycling and spends more time depressed than up.  When it is been we have been unable to completely manage this with multiple meds tried as noted above. This appt was 30 mins.  FU 3 months  Lynder Parents, MD, DFAPA  Please see After Visit Summary for patient specific instructions.  Future Appointments  Date Time Provider [place]   01/12/2020  3:00 PM Rubie Maid, MD [facility] None    No orders of the defined types were placed in this encounter.     -------------------------------

## 2019-08-05 ENCOUNTER — Other Ambulatory Visit: Payer: Self-pay | Admitting: Family Medicine

## 2019-08-15 ENCOUNTER — Other Ambulatory Visit: Payer: Self-pay | Admitting: Physician Assistant

## 2019-08-15 ENCOUNTER — Other Ambulatory Visit: Payer: Self-pay | Admitting: Psychiatry

## 2019-08-16 NOTE — Telephone Encounter (Signed)
appt 08/03

## 2019-09-15 ENCOUNTER — Encounter: Payer: Self-pay | Admitting: Psychiatry

## 2019-09-15 ENCOUNTER — Ambulatory Visit (INDEPENDENT_AMBULATORY_CARE_PROVIDER_SITE_OTHER): Payer: BC Managed Care – PPO | Admitting: Psychiatry

## 2019-09-15 ENCOUNTER — Other Ambulatory Visit: Payer: Self-pay

## 2019-09-15 DIAGNOSIS — F902 Attention-deficit hyperactivity disorder, combined type: Secondary | ICD-10-CM | POA: Diagnosis not present

## 2019-09-15 DIAGNOSIS — F319 Bipolar disorder, unspecified: Secondary | ICD-10-CM | POA: Diagnosis not present

## 2019-09-15 DIAGNOSIS — F411 Generalized anxiety disorder: Secondary | ICD-10-CM

## 2019-09-15 DIAGNOSIS — F50819 Binge eating disorder, unspecified: Secondary | ICD-10-CM

## 2019-09-15 DIAGNOSIS — F5081 Binge eating disorder: Secondary | ICD-10-CM | POA: Diagnosis not present

## 2019-09-15 DIAGNOSIS — F4522 Body dysmorphic disorder: Secondary | ICD-10-CM

## 2019-09-15 NOTE — Progress Notes (Signed)
Briana Fox [id number] Oct 06, 1971 48 y.o.  Virtual Visit via Telephone Note  I connected with pt by telephone and verified that I am speaking with the correct person using two identifiers.   I discussed the limitations, risks, security and privacy concerns of performing an evaluation and management service by telephone and the availability of in person appointments. I also discussed with the patient that there may be a patient responsible charge related to this service. The patient expressed understanding and agreed to proceed.  I discussed the assessment and treatment plan with the patient. The patient was provided an opportunity to ask questions and all were answered. The patient agreed with the plan and demonstrated an understanding of the instructions.   The patient was advised to call back or seek an in-person evaluation if the symptoms worsen or if the condition fails to improve as anticipated.  I provided 30 minutes of non-face-to-face time during this encounter. The call started at 400 and ended at 430. The patient was located at home and the provider was located office.  Subjective:   Patient ID:  Briana Fox is a 48 y.o. (DOB 1971-05-11) female.  Chief Complaint:  Chief Complaint  Patient presents with  . Follow-up    Medication Management  . ADHD    Medication Management  . Depression    Medication Management    Depression        Associated symptoms include decreased concentration.  Associated symptoms include no suicidal ideas.  Past medical history includes anxiety.   Anxiety Symptoms include decreased concentration and nervous/anxious behavior. Patient reports no confusion or suicidal ideas.      Briana Fox presents to the office today for follow-up of depression and anxiety and binge eating disorder.    When seen May 09, 2019.  She had marked improvement in the past with regard to mood and binge eating with Vyvanse but could not tolerate the dry  mouth and discontinued it.  So at the last appointment we prescribed Concerta as the next best most similar long-acting stimulant option.  However she called back stating she cannot afford that 1.  Therefore Ritalin-SR 20 mg 1 every morning was sent in and it didn't work.    Last visit was July 25, 2019 and we increased Ritalin-SR to 20 mg twice daily for residual depression, attention problems, and binge eating off label.  Didn't work out bc cost too much and it didn't help much.  Situation calmer with less demands on her with only usually 1 child at a time.  Helped her mood.  Some depression with situations with several deaths from the Covid.  Still dances but does it outside.  1 of the people in the group died.  Mood still with ups and downs but not severe.  Some depression..  She thinks menopause and struggling with age bc turning 48 yo soon the age when her Mother died.  Sister claims M overdosed.  Patient doesn't know. [name] and describes anxiety as Moderate. Anxiety symptoms include: Excessive Worry, Panic Symptoms,. Fearful of Covid and restricts herself.  Pt reports no sleep issues. Pt reports that appetite is good. Pt reports that energy is poor and down slightly. Concentration is poor. Forgetful.  Suicidal thoughts:  denied by patient.  Clonazepam 0.5 mg 1/4 tablet in the morning and 1/2 tablet at night.  Can't fully stop am doses.  Put on weight bc binge eating chocolate DT depression.  Binge eating  is out of control.  Less on meds and less on weekends.  Has tried propranolol prn for anxiety and can tell a touch of difference at 40 mg/D.    Failed psychiatric medication trials include Trileptal, carbamazepine, SSRIs, bupropion, Vraylar, Rexulti, sertraline, Geodon, Latuda, Saphris, pramipexole, Vyvanse 40 milligrams effective but dry mouth.  Ritalin SR 40 NR.  She also refuses meds that have weight gain risk.    Review of Systems:  Review of Systems   Musculoskeletal: Positive for arthralgias.  Neurological: Negative for tremors and weakness.  Psychiatric/Behavioral: Positive for decreased concentration and depression. Negative for agitation, behavioral problems, confusion, dysphoric mood, hallucinations, self-injury, sleep disturbance and suicidal ideas. The patient is nervous/anxious. The patient is not hyperactive.     Medications: I have reviewed the patient's current medications.  Current Outpatient Medications  Medication Sig Dispense Refill  . clonazePAM (KLONOPIN) 0.5 MG tablet TAKE 1 TABLET BY MOUTH AS NEEDED 30 tablet 1  . gabapentin (NEURONTIN) 300 MG capsule TAKE 1 CAPSULE BY MOUTH AT 5PM AND 1 CAPSULE AT BEDTIME 180 capsule 0  . lamoTRIgine (LAMICTAL) 150 MG tablet TAKE 1 TABLET BY MOUTH TWICE A DAY 180 tablet 0  . levothyroxine (SYNTHROID) 50 MCG tablet TAKE 1 TABLET BY MOUTH EVERY DAY ON AN EMPTY STOMACH 30-60 MINUTES BEFORE BREAKFAST 90 tablet 1  . lithium carbonate 150 MG capsule TAKE 1 CAPSULE (150 MG TOTAL) BY MOUTH EVERY EVENING. 90 capsule 0  . lithium carbonate 300 MG capsule TAKE 1 CAPSULE (300 MG TOTAL) BY MOUTH 2 (TWO) TIMES DAILY WITH A MEAL. 180 capsule 1  . omeprazole (PRILOSEC) 40 MG capsule TAKE 1 CAPSULE BY MOUTH EVERY DAY 90 capsule 2   No current facility-administered medications for this visit.     Medication Side Effects: dry mouth.  Allergies: No Known Allergies  Past Medical History:  Diagnosis Date  . Bipolar 1 disorder ([place])   . Cardiac arrhythmia   . Depression   . Endometriosis   . Gestational diabetes   . Overweight   . RLS (restless legs syndrome)     Family History  Problem Relation Age of Onset  . Heart disease Mother   . Rheum arthritis Mother   . Diabetes Father   . Endometriosis Sister   . Diabetes Sister   . Breast cancer Maternal Aunt   . Breast cancer Maternal Grandmother   . Colon cancer Neg Hx   . Ovarian cancer Neg Hx     Social History   Socioeconomic  History  . Marital status: Married    Spouse name: Not on file  . Number of children: 2  . Years of education: Not on file  . 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 Financial resource strain: Not on file  . Food insecurity    Worry: Not on file    Inability: Not on file  . Transportation needs    Medical: Not on file    Non-medical: Not on file  Tobacco Use  . Smoking status: Never Smoker  . Smokeless tobacco: Never Used  Substance and Sexual Activity  . Alcohol use: Yes    Alcohol/week: 0.0 standard drinks    Comment: occas  . Drug use: No  . Sexual activity: Yes    Birth control/protection: Surgical  Lifestyle  . Physical activity    Days per week: Not on file  Minutes per session: Not on file  . Stress: Not on file  Relationships  . Social Herbalist on phone: Not on file    Gets together: Not on file    Attends religious service: Not on file    Active member of club or organization: Not on file    Attends meetings of clubs or organizations: Not on file    Relationship status: Not on file  . Intimate partner violence    Fear of current or ex partner: Not on file    Emotionally abused: Not on file    Physically abused: Not on file    Forced sexual activity: Not on file  Other Topics Concern  . Not on file  Social History Narrative  . Not on file    Past Medical History, Surgical history, Social history, and Family history were reviewed and updated as appropriate.   Caffeine 6 cups/D bc I'm sleepy.  Please see review of systems for further details on the patient's review from today.   Objective:   Physical Exam:  There were no vitals taken for this visit.  Physical Exam Neurological:     Mental Status: She is alert and oriented to person, place, and time.     Cranial Nerves: No dysarthria.  Psychiatric:        Attention and Perception:  Attention normal. She does not perceive auditory hallucinations.        Mood and Affect: Mood is anxious and depressed.        Speech: Speech normal.        Behavior: Behavior is cooperative.        Thought Content: Thought content normal. Thought content is not paranoid or delusional. Thought content does not include homicidal or suicidal ideation. Thought content does not include homicidal or suicidal plan.        Cognition and Memory: Cognition and memory normal.        Judgment: Judgment normal.     Comments: Insight fair. Reduced anxiety and depression but still some and situational.  Hearing about deaths scare her bc thinks of her death.      Lab Review:     Component Value Date/Time   NA 141 09/16/2018 0810   K 4.4 09/16/2018 0810   CL 104 09/16/2018 0810   CO2 23 09/16/2018 0810   GLUCOSE 86 09/16/2018 0810   BUN 18 09/16/2018 0810   CREATININE 0.69 09/16/2018 0810   CALCIUM 10.8 (H) 09/16/2018 0810   PROT 6.4 09/16/2018 0810   ALBUMIN 4.4 09/16/2018 0810   AST 17 09/16/2018 0810   ALT 12 09/16/2018 0810   ALKPHOS 38 (L) 09/16/2018 0810   BILITOT 0.3 09/16/2018 0810   GFRNONAA 103 09/16/2018 0810   GFRAA 118 09/16/2018 0810    No results found for: WBC, RBC, HGB, HCT, PLT, MCV, MCH, MCHC, RDW, LYMPHSABS, MONOABS, EOSABS, BASOSABS  No results found for: POCLITH, LITHIUM   No results found for: PHENYTOIN, PHENOBARB, VALPROATE, CBMZ   .res Assessment: Plan:    Denessa was seen today for follow-up, adhd and depression.  Diagnoses and all orders for this visit:  Bipolar affective disorder, rapid cycling ([place])  Binge eating disorder  Attention deficit hyperactivity disorder (ADHD), combined type  Generalized anxiety disorder  Body image disorder     Greater than 50% of face to face time with patient was spent on counseling and coordination of care. 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  Failed multiple  other meds.  Off label stimulant Vyvanse for  depression helped mood and  the binge eating which contributes to depression.  She also found herself more productive and more focused.  She may have underlying ADD that we have not previously diagnosed.   She has not had any manic symptoms as a result and in fact is felt calmer. However she stopped the Vyvanse because she felt like she could not tolerate the dry mouth.  The Ritalin-SR 40 mg in the morning did not seem to help appreciably and cost too much so she stopped it.  We discussed the short-term risks associated with benzodiazepines including sedation and increased fall risk among others.  Discussed long-term side effect risk including dependence, potential withdrawal symptoms, and the potential eventual dose-related risk of dementia.  She is on a very low dosage and is not having significant memory complaints.  We will not try to separately address her anxiety symptoms at this time.  She has been very med sensitive and difficult to treat and therefore we should only change 1 medication at a time.  Overall her bipolar symptoms are not extreme.  She has chronic mild rapid cycling and spends more time depressed than up.  When it is been we have been unable to completely manage this with multiple meds tried as noted above.  Overall mood and anxiety are manageable at this point.  Depression and anxiety are not gone but are manageable.  She is under a little less stress situationally which has helped.  She has a history of some seasonal depression and she is somewhat concerned about that upcoming.  No med changes today Continue lithium 750 mg daily Continue lamotrigine 150 mg twice daily Continue clonazepam 0.5 mg tablet one quarter in the morning and 1/2 tablet at night Continue gabapentin 300 mg at 5 PM and nightly  Need to get labs before her next visit or as soon as convenient.  This includes lithium level, BMP, and TSH  FU 4 months  Lynder Parents, MD, DFAPA  Please see After Visit  Summary for patient specific instructions.  Future Appointments  Date Time Provider [place]  01/12/2020  3:00 PM Rubie Maid, MD [facility] None    No orders of the defined types were placed in this encounter.     -------------------------------

## 2019-09-16 ENCOUNTER — Telehealth: Payer: Self-pay

## 2019-09-16 ENCOUNTER — Other Ambulatory Visit: Payer: Self-pay | Admitting: Psychiatry

## 2019-09-16 DIAGNOSIS — Z79899 Other long term (current) drug therapy: Secondary | ICD-10-CM

## 2019-09-16 DIAGNOSIS — F319 Bipolar disorder, unspecified: Secondary | ICD-10-CM

## 2019-09-16 NOTE — Telephone Encounter (Signed)
error 

## 2019-09-21 ENCOUNTER — Other Ambulatory Visit: Payer: Self-pay | Admitting: Psychiatry

## 2019-09-28 ENCOUNTER — Telehealth: Payer: Self-pay | Admitting: Family Medicine

## 2019-09-28 NOTE — Telephone Encounter (Signed)
TSH lithium level and met b were ordered per Dr. Cottle's request on 09-16-2019, but we still haven't gotten a result. Please check with patient and see if she has labs drawn yet.  

## 2019-09-29 NOTE — Telephone Encounter (Signed)
Pt states she wanted to get Dr. Casimiro Needle labs and your labs done at the same time.  She has an office visit with you on 10/18/2019  Thanks,   -Mickel Baas

## 2019-10-03 ENCOUNTER — Other Ambulatory Visit: Payer: Self-pay | Admitting: Psychiatry

## 2019-10-18 ENCOUNTER — Encounter: Payer: Self-pay | Admitting: Family Medicine

## 2019-10-18 ENCOUNTER — Other Ambulatory Visit: Payer: Self-pay

## 2019-10-18 ENCOUNTER — Ambulatory Visit (INDEPENDENT_AMBULATORY_CARE_PROVIDER_SITE_OTHER): Payer: BC Managed Care – PPO | Admitting: Family Medicine

## 2019-10-18 VITALS — BP 116/69 | HR 74 | Temp 97.3°F | Wt 180.0 lb

## 2019-10-18 DIAGNOSIS — R5383 Other fatigue: Secondary | ICD-10-CM

## 2019-10-18 DIAGNOSIS — E039 Hypothyroidism, unspecified: Secondary | ICD-10-CM

## 2019-10-18 DIAGNOSIS — F319 Bipolar disorder, unspecified: Secondary | ICD-10-CM | POA: Diagnosis not present

## 2019-10-18 DIAGNOSIS — Z1211 Encounter for screening for malignant neoplasm of colon: Secondary | ICD-10-CM | POA: Diagnosis not present

## 2019-10-18 NOTE — Progress Notes (Signed)
Patient: Briana Fox Female    DOB: [birth year]   48 y.o.   MRN: [id number] Visit Date: 10/18/2019  Today's Provider: Lelon Huh, MD   Chief Complaint  Patient presents with  . Hypothyroidism  . Gastroesophageal Reflux   Subjective:     HPI  Hypothyroid, follow-up:  TSH  Date Value Ref Range Status  09/16/2018 4.420 0.450 - 4.500 uIU/mL Final  05/26/2016 2.810 0.450 - 4.500 uIU/mL Final  05/11/2015 2.95 0.41 - 5.90 uIU/mL Final   Wt Readings from Last 3 Encounters:  10/18/19 180 lb (81.6 kg)  01/06/19 173 lb 6.4 oz (78.7 kg)  09/13/18 173 lb (78.5 kg)    She was last seen for hypothyroid 1 years ago.  Management since that visit includes no change. She reports good compliance with treatment. She is not having side effects.  She is not exercising. She is experiencing change in energy level and weight changes She denies diarrhea, heat / cold intolerance, nervousness, palpitations and weight changes Weight trend:increased slightly  ------------------------------------------------------------------------  GERD, Follow up:  The patient was last seen for GERD 1 years ago. Changes made since that visit include increased Omeprazole to 40 mg daily .  She reports good compliance with treatment. She is not having side effects.   She IS experiencing none.  ------------------------------------------------------------------------ She is followed by Dr. Clovis Pu for BPAD and doing well on current medications. She is due for labs and patient would like to get the labs that Dr. Clovis Pu ordered done today also.   She is followed by gyn for menopausal sx and is up to date on routine exams. She is s/p TVH.   No Known Allergies   Current Outpatient Medications:  .  clonazePAM (KLONOPIN) 0.5 MG tablet, TAKE 1 TABLET BY MOUTH AS NEEDED, Disp: 30 tablet, Rfl: 1 .  gabapentin (NEURONTIN) 300 MG capsule, TAKE 1 CAPSULE BY MOUTH AT 5PM AND 1 CAPSULE AT BEDTIME, Disp:  180 capsule, Rfl: 0 .  lamoTRIgine (LAMICTAL) 150 MG tablet, TAKE 1 TABLET BY MOUTH TWICE A DAY, Disp: 180 tablet, Rfl: 0 .  levothyroxine (SYNTHROID) 50 MCG tablet, TAKE 1 TABLET BY MOUTH EVERY DAY ON AN EMPTY STOMACH 30-60 MINUTES BEFORE BREAKFAST, Disp: 90 tablet, Rfl: 1 .  lithium carbonate 150 MG capsule, TAKE 1 CAPSULE (150 MG TOTAL) BY MOUTH EVERY EVENING., Disp: 90 capsule, Rfl: 0 .  lithium carbonate 300 MG capsule, TAKE 1 CAPSULE (300 MG TOTAL) BY MOUTH 2 (TWO) TIMES DAILY WITH A MEAL., Disp: 180 capsule, Rfl: 1 .  omeprazole (PRILOSEC) 40 MG capsule, TAKE 1 CAPSULE BY MOUTH EVERY DAY, Disp: 90 capsule, Rfl: 2  Review of Systems  Constitutional: Negative.   Respiratory: Negative.   Cardiovascular: Negative.   Musculoskeletal: Negative.     Social History   Tobacco Use  . Smoking status: Never Smoker  . Smokeless tobacco: Never Used  Substance Use Topics  . Alcohol use: Yes    Alcohol/week: 0.0 standard drinks    Comment: occas      Objective:   BP 116/69 (BP Location: Right Arm, Patient Position: Sitting, Cuff Size: Normal)   Pulse 74   Temp (!) 97.3 F (36.3 C) (Temporal)   Wt 180 lb (81.6 kg)   SpO2 99%   BMI 30.90 kg/m  Vitals:   10/18/19 1600  BP: 116/69  Pulse: 74  Temp: (!) 97.3 F (36.3 C)  TempSrc: Temporal  SpO2: 99%  Weight: 180 lb (81.6 kg)  Body mass index is 30.9 kg/m.   Physical Exam   General Appearance:    Overweight female in no acute distress  Eyes:    PERRL, conjunctiva/corneas clear, EOM's intact       Lungs:     Clear to auscultation bilaterally, respirations unlabored  Heart:    Normal heart rate. Normal rhythm. No murmurs, rubs, or gallops.   MS:   All extremities are intact.   Neurologic:   Awake, alert, oriented x 3. No apparent focal neurological           defect.            Assessment & Plan    1. Colon cancer screening  - Cologuard  2. Hypothyroidism, unspecified type  - TSH - T4, free  3. Bipolar disorder,  rapid cycling ([place]) Managed by Dr. Clovis Pu who requested  - Basic Metabolic Panel (BMET) - Lithium level  4. Fatigue, unspecified type  - CBC  She refused flu vaccine today.     Lelon Huh, MD  [facility]

## 2019-10-18 NOTE — Patient Instructions (Signed)
.   Please review the attached list of medications and notify my office if there are any errors.   . Please bring all of your medications to every appointment so we can make sure that our medication list is the same as yours.   . It is especially important to get the annual flu vaccine this year. If you haven't had it already, please go to your pharmacy or call the office as soon as possible to schedule you flu shot.  

## 2019-10-24 DIAGNOSIS — F319 Bipolar disorder, unspecified: Secondary | ICD-10-CM | POA: Diagnosis not present

## 2019-10-24 DIAGNOSIS — E039 Hypothyroidism, unspecified: Secondary | ICD-10-CM | POA: Diagnosis not present

## 2019-10-24 DIAGNOSIS — R5383 Other fatigue: Secondary | ICD-10-CM | POA: Diagnosis not present

## 2019-10-25 LAB — CBC
Hematocrit: 36.5 % (ref 34.0–46.6)
Hemoglobin: 12 g/dL (ref 11.1–15.9)
MCH: 28.4 pg (ref 26.6–33.0)
MCHC: 32.9 g/dL (ref 31.5–35.7)
MCV: 86 fL (ref 79–97)
Platelets: 334 10*3/uL (ref 150–450)
RBC: 4.23 x10E6/uL (ref 3.77–5.28)
RDW: 13.5 % (ref 11.7–15.4)
WBC: 8.5 10*3/uL (ref 3.4–10.8)

## 2019-10-25 LAB — BASIC METABOLIC PANEL
BUN/Creatinine Ratio: 26 — ABNORMAL HIGH (ref 9–23)
BUN: 17 mg/dL (ref 6–24)
CO2: 23 mmol/L (ref 20–29)
Calcium: 10.9 mg/dL — ABNORMAL HIGH (ref 8.7–10.2)
Chloride: 105 mmol/L (ref 96–106)
Creatinine, Ser: 0.66 mg/dL (ref 0.57–1.00)
GFR calc Af Amer: 119 mL/min/{1.73_m2} (ref >59)
GFR calc non Af Amer: 103 mL/min/{1.73_m2} (ref >59)
Glucose: 94 mg/dL (ref 65–99)
Potassium: 4.2 mmol/L (ref 3.5–5.2)
Sodium: 143 mmol/L (ref 134–144)

## 2019-10-25 LAB — LITHIUM LEVEL: Lithium Lvl: 0.5 mmol/L — ABNORMAL LOW (ref 0.6–1.2)

## 2019-10-25 LAB — T4, FREE: Free T4: 1.32 ng/dL (ref 0.82–1.77)

## 2019-10-25 LAB — TSH: TSH: 4.42 u[IU]/mL (ref 0.450–4.500)

## 2019-10-27 ENCOUNTER — Encounter: Payer: Self-pay | Admitting: Family Medicine

## 2019-10-27 MED ORDER — LEVOTHYROXINE SODIUM 75 MCG PO TABS: ORAL_TABLET | ORAL | 0 refills | Status: DC

## 2019-11-07 ENCOUNTER — Other Ambulatory Visit: Payer: Self-pay | Admitting: Psychiatry

## 2019-11-21 DIAGNOSIS — Z1211 Encounter for screening for malignant neoplasm of colon: Secondary | ICD-10-CM | POA: Diagnosis not present

## 2019-11-21 LAB — COLOGUARD

## 2019-11-26 LAB — COLOGUARD: Cologuard: NEGATIVE

## 2019-12-13 ENCOUNTER — Encounter: Payer: Self-pay | Admitting: Family Medicine

## 2019-12-13 MED ORDER — LEVOTHYROXINE SODIUM 50 MCG PO TABS: 50.0000 ug | ORAL_TABLET | Freq: Every day | ORAL | Status: DC

## 2019-12-28 ENCOUNTER — Other Ambulatory Visit: Payer: Self-pay | Admitting: Psychiatry

## 2020-01-12 ENCOUNTER — Encounter: Payer: 59 | Admitting: Obstetrics and Gynecology

## 2020-02-01 ENCOUNTER — Other Ambulatory Visit: Payer: Self-pay | Admitting: Psychiatry

## 2020-02-02 ENCOUNTER — Other Ambulatory Visit: Payer: Self-pay | Admitting: Psychiatry

## 2020-02-07 ENCOUNTER — Other Ambulatory Visit: Payer: Self-pay | Admitting: Psychiatry

## 2020-02-08 ENCOUNTER — Other Ambulatory Visit: Payer: Self-pay

## 2020-02-08 MED ORDER — CLONAZEPAM 0.5 MG PO TABS: 0.5000 mg | ORAL_TABLET | Freq: Every day | ORAL | 1 refills | Status: DC | PRN

## 2020-02-20 ENCOUNTER — Ambulatory Visit: Payer: BC Managed Care – PPO | Attending: Internal Medicine

## 2020-02-20 DIAGNOSIS — Z20822 Contact with and (suspected) exposure to covid-19: Secondary | ICD-10-CM

## 2020-02-20 DIAGNOSIS — U071 COVID-19: Secondary | ICD-10-CM | POA: Insufficient documentation

## 2020-02-22 ENCOUNTER — Telehealth (INDEPENDENT_AMBULATORY_CARE_PROVIDER_SITE_OTHER): Payer: BC Managed Care – PPO | Admitting: Physician Assistant

## 2020-02-22 ENCOUNTER — Encounter: Payer: Self-pay | Admitting: Family Medicine

## 2020-02-22 DIAGNOSIS — R059 Cough, unspecified: Secondary | ICD-10-CM

## 2020-02-22 DIAGNOSIS — Z20822 Contact with and (suspected) exposure to covid-19: Secondary | ICD-10-CM

## 2020-02-22 DIAGNOSIS — R05 Cough: Secondary | ICD-10-CM

## 2020-02-22 LAB — SPECIMEN STATUS REPORT

## 2020-02-22 LAB — NOVEL CORONAVIRUS, NAA: SARS-CoV-2, NAA: DETECTED — AB

## 2020-02-22 MED ORDER — HYDROCOD POLST-CPM POLST ER 10-8 MG/5ML PO SUER: 5.0000 mL | Freq: Every evening | ORAL | 0 refills | Status: DC | PRN

## 2020-02-22 NOTE — Progress Notes (Signed)
   Subjective:    Patient ID: Briana Fox, female    DOB: 1971/05/11, 49 y.o.   MRN: [id number]  Javiah Geddes is a 49 y.o. female presenting on 02/22/2020 for Cough   HPI   Patient reports fatigue x 1 week and run down as if she had the flu. Denies fever, vomiting, diarrhea. Reports some nausea and cough. She was tested for COVID on 02/20/2020.   Social History   Tobacco Use  . Smoking status: Never Smoker  . Smokeless tobacco: Never Used  Substance Use Topics  . Alcohol use: Yes    Alcohol/week: 0.0 standard drinks    Comment: occas  . Drug use: No    Review of Systems Per HPI unless specifically indicated above     Objective:    There were no vitals taken for this visit.  Wt Readings from Last 3 Encounters:  10/18/19 180 lb (81.6 kg)  01/06/19 173 lb 6.4 oz (78.7 kg)  09/13/18 173 lb (78.5 kg)    Physical Exam Constitutional:      Appearance: She is ill-appearing.  Pulmonary:     Effort: Pulmonary effort is normal. No respiratory distress.  Neurological:     Mental Status: She is alert.  Psychiatric:        Mood and Affect: Mood normal.        Behavior: Behavior normal.    Results for orders placed or performed in visit on 11/28/19  Cologuard  Result Value Ref Range   Cologuard Negative Negative      Assessment & Plan:  1. Suspected COVID-19 virus infection  Counseled on quarantine protocols for COVID. Counseled to space this cough medication from her klonopin due to risk of over sedation.   - chlorpheniramine-HYDROcodone (TUSSIONEX PENNKINETIC ER) 10-8 MG/5ML SUER; Take 5 mLs by mouth at bedtime as needed for cough.  Dispense: 115 mL; Refill: 0  2. Cough  - chlorpheniramine-HYDROcodone (TUSSIONEX PENNKINETIC ER) 10-8 MG/5ML SUER; Take 5 mLs by mouth at bedtime as needed for cough.  Dispense: 115 mL; Refill: 0   F/u PRN  Carles Collet, PA-C [facility] 02/22/2020, 4:15 PM

## 2020-02-27 ENCOUNTER — Encounter: Payer: Self-pay | Admitting: Psychiatry

## 2020-02-27 ENCOUNTER — Ambulatory Visit (INDEPENDENT_AMBULATORY_CARE_PROVIDER_SITE_OTHER): Payer: BC Managed Care – PPO | Admitting: Psychiatry

## 2020-02-27 DIAGNOSIS — F902 Attention-deficit hyperactivity disorder, combined type: Secondary | ICD-10-CM | POA: Diagnosis not present

## 2020-02-27 DIAGNOSIS — F5081 Binge eating disorder: Secondary | ICD-10-CM | POA: Diagnosis not present

## 2020-02-27 DIAGNOSIS — F411 Generalized anxiety disorder: Secondary | ICD-10-CM | POA: Diagnosis not present

## 2020-02-27 DIAGNOSIS — F4522 Body dysmorphic disorder: Secondary | ICD-10-CM

## 2020-02-27 DIAGNOSIS — F319 Bipolar disorder, unspecified: Secondary | ICD-10-CM | POA: Diagnosis not present

## 2020-02-27 DIAGNOSIS — Z79899 Other long term (current) drug therapy: Secondary | ICD-10-CM

## 2020-02-27 NOTE — Progress Notes (Signed)
Briana Fox [id number] [birth year] 49 y.o.  Virtual Visit via Telephone Note  I connected with pt by telephone and verified that I am speaking with the correct person using two identifiers.   I discussed the limitations, risks, security and privacy concerns of performing an evaluation and management service by telephone and the availability of in person appointments. I also discussed with the patient that there may be a patient responsible charge related to this service. The patient expressed understanding and agreed to proceed.  I discussed the assessment and treatment plan with the patient. The patient was provided an opportunity to ask questions and all were answered. The patient agreed with the plan and demonstrated an understanding of the instructions.   The patient was advised to call back or seek an in-person evaluation if the symptoms worsen or if the condition fails to improve as anticipated.  I provided 30 minutes of non-face-to-face time during this encounter. The call started at 400 and ended at 430. The patient was located at home and the provider was located office.  Subjective:   Patient ID:  Briana Fox is a 49 y.o. (DOB 1971-06-30) female.  Chief Complaint:  Chief Complaint  Patient presents with  . Follow-up    mood and anxiety    Depression        Associated symptoms include decreased concentration.  Associated symptoms include no suicidal ideas.  Past medical history includes anxiety.   Anxiety Symptoms include decreased concentration and nervous/anxious behavior. Patient reports no confusion or suicidal ideas.      [name] presents to the office today for follow-up of depression and anxiety and binge eating disorder.    When seen May 09, 2019.  She had marked improvement in the past with regard to mood and binge eating with Vyvanse but could not tolerate the dry mouth and discontinued it.  So at the last appointment we prescribed Concerta as  the next best most similar long-acting stimulant option.  However she called back stating she cannot afford that 1.  Therefore Ritalin-SR 20 mg 1 every morning was sent in and it didn't work.    visit was July 25, 2019 and we increased Ritalin-SR to 20 mg twice daily for residual depression, attention problems, and binge eating off label.  Didn't work out bc cost too much and it didn't help much.  Last seen September 2020.  No meds were changed.  She had stopped the stimulant because it was not very helpful and was expensive.  Getting over Covid now.  Dx 2/23 sx started like the flu. No fever.  Sx remain fatigue and cough.  For the most part mood been fair and satisfactory since here.  Fine with the meds.  Situation calmer with less demands on her with only usually 1 child at a time.  Helped her mood.  Some depression with situations with several deaths from the Covid.  Still dances but does it outside.  1 of the people in the group died.  Mood still with ups and downs but not severe.  Some depression..  She thinks menopause and struggling with age bc turning 49 yo soon the age when her Mother died.  Sister claims M overdosed.  Patient doesn't know. [name] and describes anxiety as less than before mild- Moderate. Anxiety symptoms include: Excessive Worry, Panic Symptoms,.   Pt reports no sleep issues. Pt reports that appetite is good. Pt reports that energy is poor and  down slightly. Concentration is poor. Forgetful.  Suicidal thoughts:  denied by patient.  Clonazepam 0.5 mg 1/4 tablet in the morning and 1/2 tablet at night.  Can't fully stop am doses. Sleep is Ok  Put on weight bc binge eating chocolate DT depression.  Binge eating is out of control.  Less on meds and less on weekends.  Failed psychiatric medication trials include Trileptal, carbamazepine, SSRIs, bupropion, Vraylar, Rexulti, sertraline, Geodon, Latuda, Saphris, pramipexole, Vyvanse 40 milligrams  effective but dry mouth.  Ritalin SR 40 NR.  She also refuses meds that have weight gain risk.   Has tried propranolol prn for anxiety and can tell a touch of difference at 40 mg/D. Review of Systems:  Review of Systems  Respiratory: Positive for cough.   Musculoskeletal: Positive for arthralgias.  Neurological: Negative for tremors and weakness.  Psychiatric/Behavioral: Positive for decreased concentration and depression. Negative for agitation, behavioral problems, confusion, dysphoric mood, hallucinations, self-injury, sleep disturbance and suicidal ideas. The patient is nervous/anxious. The patient is not hyperactive.     Medications: I have reviewed the patient's current medications.  Current Outpatient Medications  Medication Sig Dispense Refill  . chlorpheniramine-HYDROcodone (TUSSIONEX PENNKINETIC ER) 10-8 MG/5ML SUER Take 5 mLs by mouth at bedtime as needed for cough. 115 mL 0  . clonazePAM (KLONOPIN) 0.5 MG tablet Take 1 tablet (0.5 mg total) by mouth daily as needed. 30 tablet 1  . gabapentin (NEURONTIN) 300 MG capsule TAKE 1 CAPSULE BY MOUTH AT 5PM AND 1 CAPSULE AT BEDTIME 180 capsule 0  . lamoTRIgine (LAMICTAL) 150 MG tablet TAKE 1 TABLET BY MOUTH TWICE A DAY 180 tablet 0  . levothyroxine (SYNTHROID) 50 MCG tablet Take 1 tablet (50 mcg total) by mouth daily.    Marland Kitchen lithium carbonate 150 MG capsule TAKE 1 CAPSULE (150 MG TOTAL) BY MOUTH EVERY EVENING. 90 capsule 0  . lithium carbonate 300 MG capsule TAKE 1 CAPSULE (300 MG TOTAL) BY MOUTH 2 (TWO) TIMES DAILY WITH A MEAL. 180 capsule 0  . omeprazole (PRILOSEC) 40 MG capsule TAKE 1 CAPSULE BY MOUTH EVERY DAY 90 capsule 2   No current facility-administered medications for this visit.    Medication Side Effects: dry mouth.  Allergies: No Known Allergies  Past Medical History:  Diagnosis Date  . Bipolar 1 disorder ([place])   . Cardiac arrhythmia   . Depression   . Endometriosis   . Gestational diabetes   . Overweight   . RLS  (restless legs syndrome)     Family History  Problem Relation Age of Onset  . Heart disease Mother   . Rheum arthritis Mother   . Diabetes Father   . Endometriosis Sister   . Diabetes Sister   . Breast cancer Maternal Aunt   . Breast cancer Maternal Grandmother   . Colon cancer Neg Hx   . Ovarian cancer Neg Hx     Social History   Socioeconomic History  . Marital status: Married    Spouse name: Not on file  . Number of children: 2  . Years of education: Not on file  . 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 Smoking status: Never Smoker  . Smokeless tobacco: Never Used  Substance and Sexual Activity  . Alcohol use: Yes    Alcohol/week: 0.0 standard drinks    Comment: occas  . Drug use: No  . Sexual activity: Yes  Birth control/protection: Surgical  Other Topics Concern  . Not on file  Social History Narrative  . Not on file   Social Determinants of Health   Financial Resource Strain:   . Difficulty of Paying Living Expenses: Not on file  Food Insecurity:   . Worried About Charity fundraiser in the Last Year: Not on file  . Ran Out of Food in the Last Year: Not on file  Transportation Needs:   . Lack of Transportation (Medical): Not on file  . Lack of Transportation (Non-Medical): Not on file  Physical Activity:   . Days of Exercise per Week: Not on file  . Minutes of Exercise per Session: Not on file  Stress:   . Feeling of Stress : Not on file  Social Connections:   . Frequency of Communication with Friends and Family: Not on file  . Frequency of Social Gatherings with Friends and Family: Not on file  . Attends Religious Services: Not on file  . Active Member of Clubs or Organizations: Not on file  . Attends Archivist Meetings: Not on file  . Marital Status: Not on file  Intimate Partner Violence:   . Fear of Current or Ex-Partner: Not on  file  . Emotionally Abused: Not on file  . Physically Abused: Not on file  . Sexually Abused: Not on file    Past Medical History, Surgical history, Social history, and Family history were reviewed and updated as appropriate.   Caffeine 6 cups/D bc I'm sleepy.  Please see review of systems for further details on the patient's review from today.   Objective:   Physical Exam:  There were no vitals taken for this visit.  Physical Exam Neurological:     Mental Status: She is alert and oriented to person, place, and time.     Cranial Nerves: No dysarthria.  Psychiatric:        Attention and Perception: Attention normal. She does not perceive auditory hallucinations.        Mood and Affect: Mood is anxious. Mood is not depressed.        Speech: Speech normal.        Behavior: Behavior is cooperative.        Thought Content: Thought content normal. Thought content is not paranoid or delusional. Thought content does not include homicidal or suicidal ideation. Thought content does not include homicidal or suicidal plan.        Cognition and Memory: Cognition and memory normal.        Judgment: Judgment normal.     Comments: Insight fair.       Lab Review:     Component Value Date/Time   NA 143 10/24/2019 0812   K 4.2 10/24/2019 0812   CL 105 10/24/2019 0812   CO2 23 10/24/2019 0812   GLUCOSE 94 10/24/2019 0812   BUN 17 10/24/2019 0812   CREATININE 0.66 10/24/2019 0812   CALCIUM 10.9 (H) 10/24/2019 0812   PROT 6.4 09/16/2018 0810   ALBUMIN 4.4 09/16/2018 0810   AST 17 09/16/2018 0810   ALT 12 09/16/2018 0810   ALKPHOS 38 (L) 09/16/2018 0810   BILITOT 0.3 09/16/2018 0810   GFRNONAA 103 10/24/2019 0812   GFRAA 119 10/24/2019 0812       Component Value Date/Time   WBC 8.5 10/24/2019 0812   RBC 4.23 10/24/2019 0812   HGB 12.0 10/24/2019 0812   HCT 36.5 10/24/2019 0812   PLT 334 10/24/2019 [id number]  MCV 86 10/24/2019 0812   MCH 28.4 10/24/2019 0812   MCHC 32.9  10/24/2019 0812   RDW 13.5 10/24/2019 0812    Lithium Lvl  Date Value Ref Range Status  10/24/2019 0.5 (L) 0.6 - 1.2 mmol/L Final    Comment:                                     Detection Limit = 0.1                           <0.1 indicates None Detected      No results found for: PHENYTOIN, PHENOBARB, VALPROATE, CBMZ   .res Assessment: Plan:    Ashlee was seen today for follow-up.  Diagnoses and all orders for this visit:  Bipolar affective disorder, rapid cycling ([place])  Binge eating disorder  Attention deficit hyperactivity disorder (ADHD), combined type  Generalized anxiety disorder  Body image disorder  Lithium use   Greater than 50% of face to face time with patient was spent on counseling and coordination of care. 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  Failed multiple other meds.  Off label stimulant Vyvanse for depression helped mood and  the binge eating which contributes to depression.  She also found herself more productive and more focused.  She may have underlying ADD that we have not previously diagnosed.   She has not had any manic symptoms as a result and in fact is felt calmer. However she stopped the Vyvanse because she felt like she could not tolerate the dry mouth.  The Ritalin-SR 40 mg in the morning did not seem to help appreciably and cost too much so she stopped it.  We discussed the short-term risks associated with benzodiazepines including sedation and increased fall risk among others.  Discussed long-term side effect risk including dependence, potential withdrawal symptoms, and the potential eventual dose-related risk of dementia.  She is on a very low dosage and is not having significant memory complaints.  New studies refute risk.  We will not try to separately address her anxiety symptoms at this time.  She has been very med sensitive and difficult to treat and therefore we should only change 1 medication at a time.  Overall her bipolar symptoms  are not extreme.  She has chronic mild rapid cycling and spends more time depressed than up.  When it is been we have been unable to completely manage this with multiple meds tried as noted above.  Overall mood and anxiety are manageable at this point.  Depression and anxiety are not gone but are manageable.  She is under a little less stress situationally which has helped.  She has a history of some seasonal depression and she is somewhat concerned about that upcoming.  No med changes today Continue lithium 750 mg daily Continue lamotrigine 150 mg twice daily Continue clonazepam 0.5 mg tablet one quarter in the morning and 1/2 tablet at night Continue gabapentin 300 mg at 5 PM and nightly  Labs from November were good.  Need to follow calcium which was a little elevated  FU 4 months  Lynder Parents, MD, DFAPA  Please see After Visit Summary for patient specific instructions.  Future Appointments  Date Time Provider [place]  04/26/2020  3:00 PM Rubie Maid, MD [facility] None    No orders of the defined types were placed in  this encounter.     -------------------------------

## 2020-04-26 ENCOUNTER — Encounter: Payer: Self-pay | Admitting: Obstetrics and Gynecology

## 2020-04-26 ENCOUNTER — Other Ambulatory Visit: Payer: Self-pay

## 2020-04-26 ENCOUNTER — Ambulatory Visit (INDEPENDENT_AMBULATORY_CARE_PROVIDER_SITE_OTHER): Payer: BC Managed Care – PPO | Admitting: Obstetrics and Gynecology

## 2020-04-26 VITALS — BP 146/66 | HR 82 | Ht 64.0 in | Wt 178.8 lb

## 2020-04-26 DIAGNOSIS — E66811 Obesity, class 1: Secondary | ICD-10-CM

## 2020-04-26 DIAGNOSIS — Z1231 Encounter for screening mammogram for malignant neoplasm of breast: Secondary | ICD-10-CM

## 2020-04-26 DIAGNOSIS — N941 Unspecified dyspareunia: Secondary | ICD-10-CM

## 2020-04-26 DIAGNOSIS — Z01419 Encounter for gynecological examination (general) (routine) without abnormal findings: Secondary | ICD-10-CM | POA: Diagnosis not present

## 2020-04-26 DIAGNOSIS — E669 Obesity, unspecified: Secondary | ICD-10-CM

## 2020-04-26 DIAGNOSIS — N951 Menopausal and female climacteric states: Secondary | ICD-10-CM

## 2020-04-26 DIAGNOSIS — N952 Postmenopausal atrophic vaginitis: Secondary | ICD-10-CM | POA: Diagnosis not present

## 2020-04-26 MED ORDER — ESTRADIOL 0.05 MG/24HR TD PTWK: 0.0500 mg | MEDICATED_PATCH | TRANSDERMAL | 12 refills | Status: DC

## 2020-04-26 NOTE — Progress Notes (Signed)
Pt present for annual exam. Pt c/o of hot flashes, mood swings, and painful sex.

## 2020-04-26 NOTE — Progress Notes (Signed)
ANNUAL PREVENTATIVE CARE GYNECOLOGY  ENCOUNTER NOTE  Subjective:       Briana Fox is a 49 y.o. [phone] menopausal female here for a routine annual gynecologic exam. The patient is sexually active. She reports attempting to use the vaginal estrogen tablets (Intrarosa) after her last visit last year, however did not like how messy it was so discontinued after 2 weeks. Patient denies post-menopausal vaginal bleeding. The patient wears seatbelts: yes. The patient participates in regular exercise: no. Has the patient ever been transfused or tattooed?: no. The patient reports that there is not domestic violence in her life.   Current complaints: 1.  Patient still complaints of painful intercourse. Has vaginal atrophy. She is also beginning to notice worsening hot flushes and mood changes.   2. Notes issues with weight gain. Notes weight gain that will not resolve.  Doing weight loss shakes for breakfast and lunch, small sensible meal. Exercise involves keeping toddlers (childcare).   Gynecologic History No LMP recorded. Patient has had a hysterectomy. Contraception: post menopausal status Last Pap: 1999. Results were: normal Last mammogram: 06/2016. Results were: normal Last Colonoscopy: patient has never had a colonsocopy Last Dexa Scan: Patient has never had one   Obstetric History OB History  Gravida Para Term Preterm AB Living  3 3 3     3   SAB TAB Ectopic Multiple Live Births          3    # Outcome Date GA Lbr Len/2nd Weight Sex Delivery Anes PTL Lv  3 Term      Vag-Spont   LIV  2 Term      Vag-Spont   LIV  1 Term      Vag-Spont   LIV    Past Medical History:  Diagnosis Date  . Bipolar 1 disorder ([place])   . Cardiac arrhythmia   . Depression   . Endometriosis   . Gestational diabetes   . Overweight   . RLS (restless legs syndrome)     Family History  Problem Relation Age of Onset  . Heart disease Mother   . Rheum arthritis Mother   . Diabetes Father   .  Endometriosis Sister   . Diabetes Sister   . Breast cancer Maternal Aunt   . Breast cancer Maternal Grandmother   . Colon cancer Neg Hx   . Ovarian cancer Neg Hx     Past Surgical History:  Procedure Laterality Date  . BUNIONECTOMY    . CHOLECYSTECTOMY    . FOOT SURGERY    . LAPAROSCOPY    . pulmonary function test  12/24/2010   Normal  . VAGINAL HYSTERECTOMY  08/1998   tvh    Social History   Socioeconomic History  . Marital status: Married    Spouse name: Not on file  . Number of children: 2  . Years of education: Not on file  . 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 Smoking status: Never Smoker  . Smokeless tobacco: Never Used  Substance and Sexual Activity  . Alcohol use: Yes    Alcohol/week: 0.0 standard drinks    Comment: occas  . Drug use: No  . Sexual activity: Yes    Birth control/protection: Surgical  Other Topics Concern  . Not on file  Social History Narrative  . Not on file   Social Determinants of Health   Financial  Resource Strain:   . Difficulty of Paying Living Expenses:   Food Insecurity:   . Worried About Charity fundraiser in the Last Year:   . Arboriculturist in the Last Year:   Transportation Needs:   . Film/video editor (Medical):   Marland Kitchen Lack of Transportation (Non-Medical):   Physical Activity:   . Days of Exercise per Week:   . Minutes of Exercise per Session:   Stress:   . Feeling of Stress :   Social Connections:   . Frequency of Communication with Friends and Family:   . Frequency of Social Gatherings with Friends and Family:   . Attends Religious Services:   . Active Member of Clubs or Organizations:   . Attends Archivist Meetings:   Marland Kitchen Marital Status:   Intimate Partner Violence:   . Fear of Current or Ex-Partner:   . Emotionally Abused:   Marland Kitchen Physically Abused:   . Sexually Abused:     Current  Outpatient Medications on File Prior to Visit  Medication Sig Dispense Refill  . chlorpheniramine-HYDROcodone (TUSSIONEX PENNKINETIC ER) 10-8 MG/5ML SUER Take 5 mLs by mouth at bedtime as needed for cough. 115 mL 0  . clonazePAM (KLONOPIN) 0.5 MG tablet Take 1 tablet (0.5 mg total) by mouth daily as needed. 30 tablet 1  . gabapentin (NEURONTIN) 300 MG capsule TAKE 1 CAPSULE BY MOUTH AT 5PM AND 1 CAPSULE AT BEDTIME 180 capsule 0  . lamoTRIgine (LAMICTAL) 150 MG tablet TAKE 1 TABLET BY MOUTH TWICE A DAY 180 tablet 0  . levothyroxine (SYNTHROID) 50 MCG tablet Take 1 tablet (50 mcg total) by mouth daily.    Marland Kitchen lithium carbonate 150 MG capsule TAKE 1 CAPSULE (150 MG TOTAL) BY MOUTH EVERY EVENING. 90 capsule 0  . lithium carbonate 300 MG capsule TAKE 1 CAPSULE (300 MG TOTAL) BY MOUTH 2 (TWO) TIMES DAILY WITH A MEAL. 180 capsule 0  . omeprazole (PRILOSEC) 40 MG capsule TAKE 1 CAPSULE BY MOUTH EVERY DAY 90 capsule 2   No current facility-administered medications on file prior to visit.    No Known Allergies    Review of Systems ROS Review of Systems - General ROS: negative for - chills, fatigue, fever, weight gain or weight loss. Hot flashes, night sweats (mild, occasional), weight gain Psychological ROS: negative for - anxiety, decreased libido, depression, mood swings, physical abuse or sexual abuse Ophthalmic ROS: negative for - blurry vision, eye pain or loss of vision ENT ROS: negative for - headaches, hearing change, visual changes or vocal changes Allergy and Immunology ROS: negative for - hives, itchy/watery eyes or seasonal allergies Hematological and Lymphatic ROS: negative for - bleeding problems, bruising, swollen lymph nodes or weight loss Endocrine ROS: negative for - galactorrhea, hair pattern changes, hot flashes, malaise/lethargy, mood swings, palpitations, polydipsia/polyuria, skin changes, temperature intolerance or unexpected weight changes Breast ROS: negative for - new or  changing breast lumps or nipple discharge Respiratory ROS: negative for - cough or shortness of breath Cardiovascular ROS: negative for - chest pain, irregular heartbeat, palpitations or shortness of breath Gastrointestinal ROS: no abdominal pain, change in bowel habits, or black or bloody stools Genito-Urinary ROS: no dysuria, trouble voiding, or hematuria. Painful intercourse  Musculoskeletal ROS: negative for - joint pain or joint stiffness Neurological ROS: negative for - bowel and bladder control changes Dermatological ROS: negative for rash and skin lesion changes   Objective:   BP (!) 146/66   Pulse 82   Ht  5\' 4"  (1.626 m)   Wt 178 lb 12.8 oz (81.1 kg)   BMI 30.69 kg/m  CONSTITUTIONAL: Well-developed, well-nourished female in no acute distress. Obesity, mild. PSYCHIATRIC: Normal mood and affect. Normal behavior. Normal judgment and thought content. [place]: Alert and oriented to person, place, and time. Normal muscle tone coordination. No cranial nerve deficit noted. HENT:  Normocephalic, atraumatic, External right and left ear normal. Oropharynx is clear and moist EYES: Conjunctivae and EOM are normal. Pupils are equal, round, and reactive to light. No scleral icterus.  NECK: Normal range of motion, supple, no masses.  Normal thyroid.  SKIN: Skin is warm and dry. No rash noted. Not diaphoretic. No erythema. No pallor. CARDIOVASCULAR: Normal heart rate noted, regular rhythm, no murmur. RESPIRATORY: Clear to auscultation bilaterally. Effort and breath sounds normal, no problems with respiration noted. BREASTS: Symmetric in size. No masses, skin changes, nipple drainage, or lymphadenopathy. ABDOMEN: Soft, normal bowel sounds, no distention noted.  No tenderness, rebound or guarding.  BLADDER: Normal PELVIC:  Bladder no bladder distension noted  Urethra: normal appearing urethra with no masses, tenderness or lesions  Vulva: normal appearing vulva with no masses, tenderness or  lesions  Vagina: normal appearing vagina with no discharge, no lesions and moderately atrophic  Cervix: surgically absent  Uterus: surgically absent, vaginal cuff well healed  Adnexa: normal adnexa in size, nontender and no masses  RV: External Exam NormaI, No Rectal Masses and Normal Sphincter tone  MUSCULOSKELETAL: Normal range of motion. No tenderness.  No cyanosis, clubbing, or edema.  2+ distal pulses. LYMPHATIC: No Axillary, Supraclavicular, or Inguinal Adenopathy.   Labs: Lab Results  Component Value Date   WBC 8.5 10/24/2019   HGB 12.0 10/24/2019   HCT 36.5 10/24/2019   MCV 86 10/24/2019   PLT 334 10/24/2019    Lab Results  Component Value Date   CREATININE 0.66 10/24/2019   BUN 17 10/24/2019   NA 143 10/24/2019   K 4.2 10/24/2019   CL 105 10/24/2019   CO2 23 10/24/2019    Lab Results  Component Value Date   ALT 12 09/16/2018   AST 17 09/16/2018   ALKPHOS 38 (L) 09/16/2018   BILITOT 0.3 09/16/2018    Lab Results  Component Value Date   CHOL 176 09/16/2018   HDL 58 09/16/2018   LDLCALC 105 (H) 09/16/2018   TRIG 63 09/16/2018   CHOLHDL 3.0 09/16/2018    Lab Results  Component Value Date   TSH 4.420 10/24/2019    No results found for: HGBA1C   Assessment:   Encounter for well woman exam with routine gynecological exam Vaginal atrophy Dyspareunia in female Menopausal vasomotor syndrome Mild obesity   Plan:  - Pap: Not needed.  - Mammogram: Ordered - Stool Guaiac Testing:  Ordered colonoscopy.  - Labs: None ordered. Performed by PCP.  - Routine preventative health maintenance measures emphasized: Exercise/Diet/Weight control, Alcohol/Substance use risks and Stress Management.  - Discussed patient's vaginal atrophy, dyspareunia were likely due to menopausal state and estrogen deficiency. Patient also with mild vasomotor symptoms.  Discussed options for treatment, including HRT, vs other non-hormonal options for vasomotor symptoms   Patient now  ok to consider systemic HRT.  Will prescribe Cl[place]ara patch.  After counseling of all options, patient offered trial of Intrarosa. Will f/u in 6 weeks to reassess symptoms.  - Advised on natural supplements that could help to improve weight loss efforts (I.e. green tea extract, apple cider vinegar supplements, lemon infused water, coffee, ginger, and cayenne  pepper). Also advised that patient may have too low of a caloric intake, and may need to include 1-2 small snacks per day (protein based).  - Return to [place], MD  [facility]

## 2020-04-26 NOTE — Patient Instructions (Signed)
Menopause and Hormone Replacement Therapy Menopause is a normal time of life when menstrual periods stop completely and the ovaries stop producing the female hormones estrogen and progesterone. This lack of hormones can affect your health and cause undesirable symptoms. Hormone replacement therapy (HRT) can relieve some of those symptoms. What is hormone replacement therapy? HRT is the use of artificial (synthetic) hormones to replace hormones that your body has stopped producing because you have reached menopause. What are my options for HRT?  HRT may consist of the synthetic hormones estrogen and progestin, or it may consist of only estrogen (estrogen-only therapy). You and your health care provider will decide which form of HRT is best for you. If you choose to be on HRT and you have a uterus, estrogen and progestin are usually prescribed. Estrogen-only therapy is used for women who do not have a uterus. Possible options for taking HRT include:  Pills.  Patches.  Gels.  Sprays.  Vaginal cream.  Vaginal rings.  Vaginal inserts. The amount of hormone(s) that you take and how long you take the hormone(s) varies according to your health. It is important to:  Begin HRT with the lowest possible dosage.  Stop HRT as soon as your health care provider tells you to stop.  Work with your health care provider so that you feel informed and comfortable with your decisions. What are the benefits of HRT? HRT can reduce the frequency and severity of menopausal symptoms. Benefits of HRT vary according to the kind of symptoms that you have, how severe they are, and your overall health. HRT may help to improve the following symptoms of menopause:  Hot flashes and night sweats. These are sudden feelings of heat that spread over the face and body. The skin may turn red, like a blush. Night sweats are hot flashes that happen while you are sleeping or trying to sleep.  Bone loss (osteoporosis). The  body loses calcium more quickly after menopause, causing the bones to become weaker. This can increase the risk for bone breaks (fractures).  Vaginal dryness. The lining of the vagina can become thin and dry, which can cause pain during sex or cause infection, burning, or itching.  Urinary tract infections.  Urinary incontinence. This is the inability to control when you pass urine.  Irritability.  Short-term memory problems. What are the risks of HRT? Risks of HRT vary depending on your individual health and medical history. Risks of HRT also depend on whether you receive both estrogen and progestin or you receive estrogen only. HRT may increase the risk of:  Spotting. This is when a small amount of blood leaks from the vagina unexpectedly.  Endometrial cancer. This cancer is in the lining of the uterus (endometrium).  Breast cancer.  Increased density of breast tissue. This can make it harder to find breast cancer on a breast X-ray (mammogram).  Stroke.  Heart disease.  Blood clots.  Gallbladder disease.  Liver disease. Risks of HRT can increase if you have any of the following conditions:  Endometrial cancer.  Liver disease.  Heart disease.  Breast cancer.  History of blood clots.  History of stroke. Follow these instructions at home:  Take over-the-counter and prescription medicines only as told by your health care provider.  Get mammograms, pelvic exams, and medical checkups as often as told by your health care provider.  Have Pap tests done as often as told by your health care provider. A Pap test is sometimes called a Pap smear. It   is a screening test that is used to check for signs of cancer of the cervix and vagina. A Pap test can also identify the presence of infection or precancerous changes. Pap tests may be done: ? Every 3 years, starting at age 67. ? Every 5 years, starting after age 37, in combination with testing for human papillomavirus  (HPV). ? More often or less often depending on other medical conditions you have, your age, and other risk factors.  It is up to you to get the results of your Pap test. Ask your health care provider, or the department that is doing the test, when your results will be ready.  Keep all follow-up visits as told by your health care provider. This is important. Contact a health care provider if you have:  Pain or swelling in your legs.  Shortness of breath.  Chest pain.  Lumps or changes in your breasts or armpits.  Slurred speech.  Pain, burning, or bleeding when you urinate.  Unusual vaginal bleeding.  Dizziness or headaches.  Weakness or numbness in any part of your arms or legs.  Pain in your abdomen. Summary  Menopause is a normal time of life when menstrual periods stop completely and the ovaries stop producing the female hormones estrogen and progesterone.  Hormone replacement therapy (HRT) can relieve some of the symptoms of menopause.  HRT can reduce the frequency and severity of menopausal symptoms.  Risks of HRT vary depending on your individual health and medical history. This information is not intended to replace advice given to you by your health care provider. Make sure you discuss any questions you have with your health care provider. Document Revised: 08/10/2018 Document Reviewed: 08/10/2018 Elsevier Patient Education  2020 [place] Breast self-awareness means being familiar with how your breasts look and feel. It involves checking your breasts regularly and reporting any changes to your health care provider. Practicing breast self-awareness is important. Sometimes changes may not be harmful (are benign), but sometimes a change in your breasts can be a sign of a serious medical problem. It is important to learn how to do this procedure correctly so that you can catch problems early, when treatment is more likely to be  successful. All women should practice breast self-awareness, including women who have had breast implants. What you need:  A mirror.  A well-lit room. How to do a breast self-exam A breast self-exam is one way to learn what is normal for your breasts and whether your breasts are changing. To do a breast self-exam: Look for changes  1. Remove all the clothing above your waist. 2. Stand in front of a mirror in a room with good lighting. 3. Put your hands on your hips. 4. Push your hands firmly downward. 5. Compare your breasts in the mirror. Look for differences between them (asymmetry), such as: ? Differences in shape. ? Differences in size. ? Puckers, dips, and bumps in one breast and not the other. 6. Look at each breast for changes in the skin, such as: ? Redness. ? Scaly areas. 7. Look for changes in your nipples, such as: ? Discharge. ? Bleeding. ? Dimpling. ? Redness. ? A change in position. Feel for changes Carefully feel your breasts for lumps and changes. It is best to do this while lying on your back on the floor, and again while sitting or standing in the tub or shower with soapy water on your skin. Feel each breast in the  following way: 1. Place the arm on the side of the breast you are examining above your head. 2. Feel your breast with the other hand. 3. Start in the nipple area and make -inch (2 cm) overlapping circles to feel your breast. Use the pads of your three middle fingers to do this. Apply light pressure, then medium pressure, then firm pressure. The light pressure will allow you to feel the tissue closest to the skin. The medium pressure will allow you to feel the tissue that is a little deeper. The firm pressure will allow you to feel the tissue close to the ribs. 4. Continue the overlapping circles, moving downward over the breast until you feel your ribs below your breast. 5. Move one finger-width toward the center of the body. Continue to use the -inch  (2 cm) overlapping circles to feel your breast as you move slowly up toward your collarbone. 6. Continue the up-and-down exam using all three pressures until you reach your armpit.  Write down what you find Writing down what you find can help you remember what to discuss with your health care provider. Write down:  What is normal for each breast.  Any changes that you find in each breast, including: ? The kind of changes you find. ? Any pain or tenderness. ? Size and location of any lumps.  Where you are in your menstrual cycle, if you are still menstruating. General tips and recommendations  Examine your breasts every month.  If you are breastfeeding, the best time to examine your breasts is after a feeding or after using a breast pump.  If you menstruate, the best time to examine your breasts is 5-7 days after your period. Breasts are generally lumpier during menstrual periods, and it may be more difficult to notice changes.  With time and practice, you will become more familiar with the variations in your breasts and more comfortable with the exam. Contact a health care provider if you:  See a change in the shape or size of your breasts or nipples.  See a change in the skin of your breast or nipples, such as a reddened or scaly area.  Have unusual discharge from your nipples.  Find a lump or thick area that was not there before.  Have pain in your breasts.  Have any concerns related to your breast health. Summary  Breast self-awareness includes looking for physical changes in your breasts, as well as feeling for any changes within your breasts.  Breast self-awareness should be performed in front of a mirror in a well-lit room.  You should examine your breasts every month. If you menstruate, the best time to examine your breasts is 5-7 days after your menstrual period.  Let your health care provider know of any changes you notice in your breasts, including changes in  size, changes on the skin, pain or tenderness, or unusual fluid from your nipples. This information is not intended to replace advice given to you by your health care provider. Make sure you discuss any questions you have with your health care provider. Document Revised: 07/27/2018 Document Reviewed: 07/27/2018 Elsevier Patient Education  [place].

## 2020-04-30 ENCOUNTER — Other Ambulatory Visit: Payer: Self-pay | Admitting: Family Medicine

## 2020-04-30 ENCOUNTER — Other Ambulatory Visit: Payer: Self-pay | Admitting: Psychiatry

## 2020-05-01 ENCOUNTER — Other Ambulatory Visit: Payer: Self-pay | Admitting: Psychiatry

## 2020-05-18 ENCOUNTER — Other Ambulatory Visit: Payer: Self-pay | Admitting: Family Medicine

## 2020-05-20 ENCOUNTER — Other Ambulatory Visit: Payer: Self-pay | Admitting: Obstetrics and Gynecology

## 2020-06-08 ENCOUNTER — Ambulatory Visit: Payer: Self-pay | Admitting: Obstetrics and Gynecology

## 2020-06-13 ENCOUNTER — Ambulatory Visit: Payer: Self-pay | Admitting: Obstetrics and Gynecology

## 2020-06-13 ENCOUNTER — Other Ambulatory Visit: Payer: Self-pay | Admitting: Psychiatry

## 2020-06-22 ENCOUNTER — Encounter: Payer: Self-pay | Admitting: Obstetrics and Gynecology

## 2020-07-14 ENCOUNTER — Other Ambulatory Visit: Payer: Self-pay | Admitting: Psychiatry

## 2020-07-17 ENCOUNTER — Other Ambulatory Visit: Payer: Self-pay | Admitting: Psychiatry

## 2020-07-25 ENCOUNTER — Other Ambulatory Visit: Payer: Self-pay | Admitting: Psychiatry

## 2020-07-26 ENCOUNTER — Other Ambulatory Visit: Payer: Self-pay | Admitting: Psychiatry

## 2020-08-02 ENCOUNTER — Encounter: Payer: Self-pay | Admitting: Psychiatry

## 2020-08-02 ENCOUNTER — Ambulatory Visit (INDEPENDENT_AMBULATORY_CARE_PROVIDER_SITE_OTHER): Payer: BC Managed Care – PPO | Admitting: Psychiatry

## 2020-08-02 ENCOUNTER — Other Ambulatory Visit: Payer: Self-pay

## 2020-08-02 DIAGNOSIS — F902 Attention-deficit hyperactivity disorder, combined type: Secondary | ICD-10-CM | POA: Diagnosis not present

## 2020-08-02 DIAGNOSIS — F5081 Binge eating disorder: Secondary | ICD-10-CM | POA: Diagnosis not present

## 2020-08-02 DIAGNOSIS — Z79899 Other long term (current) drug therapy: Secondary | ICD-10-CM

## 2020-08-02 DIAGNOSIS — F411 Generalized anxiety disorder: Secondary | ICD-10-CM | POA: Diagnosis not present

## 2020-08-02 DIAGNOSIS — F319 Bipolar disorder, unspecified: Secondary | ICD-10-CM

## 2020-08-02 DIAGNOSIS — F50819 Binge eating disorder, unspecified: Secondary | ICD-10-CM

## 2020-08-02 DIAGNOSIS — F4522 Body dysmorphic disorder: Secondary | ICD-10-CM

## 2020-08-02 DIAGNOSIS — F5105 Insomnia due to other mental disorder: Secondary | ICD-10-CM

## 2020-08-02 MED ORDER — ALPRAZOLAM 0.25 MG PO TABS: 0.2500 mg | ORAL_TABLET | Freq: Three times a day (TID) | ORAL | 1 refills | Status: DC | PRN

## 2020-08-02 NOTE — Progress Notes (Signed)
Lashala Laser [id number] [birth year] 49 y.o.    Subjective:   Patient ID:  Briana Fox is a 49 y.o. (DOB April 04, 1971) female.  Chief Complaint:  Chief Complaint  Patient presents with  . Follow-up    some mood swings  . Stress  . Anxiety    Depression        Associated symptoms include decreased concentration.  Associated symptoms include no suicidal ideas.  Past medical history includes anxiety.   Anxiety Symptoms include decreased concentration and nervous/anxious behavior. Patient reports no confusion or suicidal ideas.      Briana Fox presents to the office today for follow-up of depression and anxiety and binge eating disorder.    When seen May 09, 2019.  She had marked improvement in the past with regard to mood and binge eating with Vyvanse but could not tolerate the dry mouth and discontinued it.  So at the last appointment we prescribed Concerta as the next best most similar long-acting stimulant option.  However she called back stating she cannot afford that 1.  Therefore Ritalin-SR 20 mg 1 every morning was sent in and it didn't work.    visit was July 25, 2019 and we increased Ritalin-SR to 20 mg twice daily for residual depression, attention problems, and binge eating off label.  Didn't work out bc cost too much and it didn't help much.  seen September 2020.  No meds were changed.  She had stopped the stimulant because it was not very helpful and was expensive.  March 2021 appointment the following is noted: Getting over Covid now.  Dx 2/23 sx started like the flu. No fever.  Sx remain fatigue and cough. For the most part mood been fair and satisfactory since here.  Fine with the meds. Situation calmer with less demands on her with only usually 1 child at a time.  Helped her mood.  Some depression with situations with several deaths from the Covid.  Still dances but does it outside.  1 of the people in the group died.  Mood still with ups and downs but  not severe.  Some depression..  She thinks menopause and struggling with age bc turning 49 yo soon the age when her Mother died.  Sister claims M overdosed.  Patient doesn't know. [name] and describes anxiety as less than before mild- Moderate. Anxiety symptoms include: Excessive Worry, Panic Symptoms,.   Pt reports no sleep issues. Pt reports that appetite is good. Pt reports that energy is poor and down slightly. Concentration is poor. Forgetful.  Suicidal thoughts:  denied by patient. No med changes  08/02/2020 appointment the following is noted: Still doesn't have smell back. Lots of stress keeping 4 gkids.  But it will drop to 2 when they start school.  At times will notice feeling small amount of rage without a reason.  It's very irritating but brief.  Sometimes out of the blue. Very scared of Covid but hasn't gotten vaccine. Takes clonazepam at night mainly and asks to increase it bc initial and terminal.  Stay stressed.  My weight consumes me always and will think of it at night for sleep. Some concerns about memory.  Clonazepam 0.5 mg 1/4 tablet in the morning and 1/2 tablet at night.  Can't fully stop am doses. Sleep is Ok  Put on weight bc binge eating chocolate DT depression.  Binge eating is out of control.  Less on meds and less on weekends.  Failed psychiatric medication trials include Trileptal, carbamazepine,  SSRIs, bupropion,  sertraline, Vraylar, Rexulti, Geodon, Latuda, Saphris, pramipexole,  Vyvanse 40 milligrams effective but dry mouth.  Ritalin SR 40 NR.   She also refuses meds that have weight gain risk.   Has tried propranolol prn for anxiety and can tell a touch of difference at 40 mg/D. Review of Systems:  Review of Systems  Respiratory: Negative for cough.   Musculoskeletal: Positive for arthralgias.  Neurological: Negative for tremors and weakness.  Psychiatric/Behavioral: Positive for decreased concentration and depression. Negative for  agitation, behavioral problems, confusion, dysphoric mood, hallucinations, self-injury, sleep disturbance and suicidal ideas. The patient is nervous/anxious. The patient is not hyperactive.     Medications: I have reviewed the patient's current medications.  Current Outpatient Medications  Medication Sig Dispense Refill  . chlorpheniramine-HYDROcodone (TUSSIONEX PENNKINETIC ER) 10-8 MG/5ML SUER Take 5 mLs by mouth at bedtime as needed for cough. 115 mL 0  . CLIMARA 0.05 MG/24HR patch PLACE 1 PATCH (0.05 MG TOTAL) ONTO THE SKIN ONCE A WEEK. 12 patch 3  . clonazePAM (KLONOPIN) 0.5 MG tablet TAKE 1 TABLET BY MOUTH EVERY DAY AS NEEDED 30 tablet 1  . gabapentin (NEURONTIN) 300 MG capsule TAKE 1 CAPSULE BY MOUTH AT 5PM AND 1 CAPSULE AT BEDTIME 180 capsule 0  . lamoTRIgine (LAMICTAL) 150 MG tablet TAKE 1 TABLET BY MOUTH TWICE A DAY 180 tablet 0  . levothyroxine (SYNTHROID) 50 MCG tablet TAKE 1 TABLET BY MOUTH EVERY DAY ON AN EMPTY STOMACH 30-60 MINUTES BEFORE BREAKFAST 90 tablet 0  . lithium carbonate 150 MG capsule TAKE 1 CAPSULE (150 MG TOTAL) BY MOUTH EVERY EVENING. 90 capsule 0  . lithium carbonate 300 MG capsule TAKE 1 CAPSULE (300 MG TOTAL) BY MOUTH 2 (TWO) TIMES DAILY WITH A MEAL. 180 capsule 0  . omeprazole (PRILOSEC) 40 MG capsule TAKE 1 CAPSULE BY MOUTH EVERY DAY 90 capsule 2   No current facility-administered medications for this visit.    Medication Side Effects: dry mouth.  Allergies: No Known Allergies  Past Medical History:  Diagnosis Date  . Bipolar 1 disorder ([place])   . Cardiac arrhythmia   . Depression   . Endometriosis   . Gestational diabetes   . Overweight   . RLS (restless legs syndrome)     Family History  Problem Relation Age of Onset  . Heart disease Mother   . Rheum arthritis Mother   . Diabetes Father   . Endometriosis Sister   . Diabetes Sister   . Breast cancer Maternal Aunt   . Breast cancer Maternal Grandmother   . Colon cancer Neg Hx   . Ovarian  cancer Neg Hx     Social History   Socioeconomic History  . Marital status: Married    Spouse name: Not on file  . Number of children: 2  . Years of education: Not on file  . 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 Smoking status: Never Smoker  . Smokeless tobacco: Never Used  Vaping Use  . Vaping Use: Never used  Substance and Sexual Activity  . Alcohol use: Yes    Alcohol/week: 0.0 standard drinks    Comment: occas  . Drug use: No  . Sexual activity: Yes    Birth control/protection: Surgical  Other Topics Concern  . Not on file  Social History Narrative  . Not on  file   Social Determinants of Health   Financial Resource Strain:   . Difficulty of Paying Living Expenses:   Food Insecurity:   . Worried About Charity fundraiser in the Last Year:   . Arboriculturist in the Last Year:   Transportation Needs:   . Film/video editor (Medical):   Marland Kitchen Lack of Transportation (Non-Medical):   Physical Activity:   . Days of Exercise per Week:   . Minutes of Exercise per Session:   Stress:   . Feeling of Stress :   Social Connections:   . Frequency of Communication with Friends and Family:   . Frequency of Social Gatherings with Friends and Family:   . Attends Religious Services:   . Active Member of Clubs or Organizations:   . Attends Archivist Meetings:   Marland Kitchen Marital Status:   Intimate Partner Violence:   . Fear of Current or Ex-Partner:   . Emotionally Abused:   Marland Kitchen Physically Abused:   . Sexually Abused:     Past Medical History, Surgical history, Social history, and Family history were reviewed and updated as appropriate.   Caffeine 6 cups/D bc I'm sleepy.  Please see review of systems for further details on the patient's review from today.   Objective:   Physical Exam:  There were no vitals taken for this visit.  Physical Exam Neurological:      Mental Status: She is alert and oriented to person, place, and time.     Cranial Nerves: No dysarthria.  Psychiatric:        Attention and Perception: Attention normal. She does not perceive auditory hallucinations.        Mood and Affect: Mood is anxious. Mood is not depressed. Affect is not labile or tearful.        Speech: Speech normal.        Behavior: Behavior is cooperative.        Thought Content: Thought content normal. Thought content is not paranoid or delusional. Thought content does not include homicidal or suicidal ideation. Thought content does not include homicidal or suicidal plan.        Cognition and Memory: Cognition and memory normal.        Judgment: Judgment normal.     Comments: Insight fair.       Lab Review:     Component Value Date/Time   NA 143 10/24/2019 0812   K 4.2 10/24/2019 0812   CL 105 10/24/2019 0812   CO2 23 10/24/2019 0812   GLUCOSE 94 10/24/2019 0812   BUN 17 10/24/2019 0812   CREATININE 0.66 10/24/2019 0812   CALCIUM 10.9 (H) 10/24/2019 0812   PROT 6.4 09/16/2018 0810   ALBUMIN 4.4 09/16/2018 0810   AST 17 09/16/2018 0810   ALT 12 09/16/2018 0810   ALKPHOS 38 (L) 09/16/2018 0810   BILITOT 0.3 09/16/2018 0810   GFRNONAA 103 10/24/2019 0812   GFRAA 119 10/24/2019 0812       Component Value Date/Time   WBC 8.5 10/24/2019 0812   RBC 4.23 10/24/2019 0812   HGB 12.0 10/24/2019 0812   HCT 36.5 10/24/2019 0812   PLT 334 10/24/2019 0812   MCV 86 10/24/2019 0812   MCH 28.4 10/24/2019 0812   MCHC 32.9 10/24/2019 0812   RDW 13.5 10/24/2019 0812    Lithium Lvl  Date Value Ref Range Status  10/24/2019 0.5 (L) 0.6 - 1.2 mmol/L Final    Comment:  Detection Limit = 0.1                           <0.1 indicates None Detected      No results found for: PHENYTOIN, PHENOBARB, VALPROATE, CBMZ   .res Assessment: Plan:    Briana Fox was seen today for follow-up, stress and anxiety.  Diagnoses and all  orders for this visit:  Bipolar affective disorder, rapid cycling ([place])  Binge eating disorder  Attention deficit hyperactivity disorder (ADHD), combined type  Generalized anxiety disorder  Body image disorder  Lithium use   Greater than 50% of face to face time with patient was spent on counseling and coordination of care. 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  Failed multiple other meds.  Off label stimulant Vyvanse for depression helped mood and  the binge eating which contributes to depression.    We discussed the short-term risks associated with benzodiazepines including sedation and increased fall risk among others.  Discussed long-term side effect risk including dependence, potential withdrawal symptoms, and the potential eventual dose-related risk of dementia.  She is on a very low dosage and is not having significant memory complaints.  New studies refute risk.  But disc risk short term memory trouble.  Disc obesity and binge eating and suggest she discuss Ozempic with PCP.  Somewhat chronically stressed situationally.  Not likely to get better with med changes.  Overall her bipolar symptoms are not extreme.  She has chronic mild rapid cycling and spends more time depressed than up.  When it is been we have been unable to completely manage this with multiple meds tried as noted above.  Overall mood and anxiety are manageable at this point.  Depression and anxiety are not gone but are manageable.  She is under a little less stress situationally which has helped.  She has a history of some seasonal depression and she is somewhat concerned about that upcoming.  Continue lithium 750 mg daily Continue lamotrigine 150 mg twice daily Switch to alprazolam to improve sleep 0.25-0.5 mg HS and 0.25 mg prn anxiety Continue gabapentin 300 mg at 5 PM and nightly Add B complex vitamin for memory  Labs from November 2020 were good.  Need to follow calcium which was a little elevated  FU 4  months  Lynder Parents, MD, DFAPA  Please see After Visit Summary for patient specific instructions.  Future Appointments  Date Time Provider [place]  04/30/2021  3:00 PM Rubie Maid, MD [facility] None    No orders of the defined types were placed in this encounter.     -------------------------------

## 2020-08-02 NOTE — Patient Instructions (Signed)
Add B complex vitamin daily for memory

## 2020-08-11 ENCOUNTER — Other Ambulatory Visit: Payer: Self-pay | Admitting: Family Medicine

## 2020-08-11 NOTE — Telephone Encounter (Signed)
Requested Prescriptions  Pending Prescriptions Disp Refills   levothyroxine (SYNTHROID) 50 MCG tablet [Pharmacy Med Name: LEVOTHYROXINE 50 MCG TABLET] 90 tablet 0    Sig: TAKE 1 TABLET BY MOUTH EVERY DAY ON AN EMPTY STOMACH 30-60 [place]     Endocrinology:  Hypothyroid Agents Failed - 08/11/2020  9:06 AM      Failed - TSH needs to be rechecked within 3 months after an abnormal result. Refill until TSH is due.      Passed - TSH in normal range and within 360 days    TSH  Date Value Ref Range Status  10/24/2019 4.420 0.450 - 4.500 uIU/mL Final         Passed - Valid encounter within last 12 months    Recent Outpatient Visits          5 months ago Suspected COVID-19 virus infection   [place], [place], [place]   9 months ago Colon cancer screening   [facility] Birdie Sons, MD   1 year ago Hypothyroidism, unspecified type   [facility] Birdie Sons, MD   3 years ago Gastroesophageal reflux disease, esophagitis presence not specified   [facility] Birdie Sons, MD   4 years ago Hypothyroidism, unspecified hypothyroidism type   [facility] Birdie Sons, MD      Future Appointments            In 8 months Rubie Maid, MD [facility]

## 2020-09-10 ENCOUNTER — Telehealth: Payer: Self-pay | Admitting: Psychiatry

## 2020-09-10 ENCOUNTER — Other Ambulatory Visit: Payer: Self-pay | Admitting: Psychiatry

## 2020-09-10 MED ORDER — CLONAZEPAM 0.5 MG PO TABS: 0.5000 mg | ORAL_TABLET | Freq: Every day | ORAL | 0 refills | Status: DC

## 2020-09-10 NOTE — Telephone Encounter (Signed)
RX sent for clonazepam

## 2020-09-10 NOTE — Telephone Encounter (Signed)
Pt called started taking Xanax & don't like them. Would like you to call in Clonazepam.   [facility] on file. [address]

## 2020-09-10 NOTE — Telephone Encounter (Signed)
Review please

## 2020-09-11 ENCOUNTER — Other Ambulatory Visit: Payer: Self-pay | Admitting: Psychiatry

## 2020-09-11 NOTE — Telephone Encounter (Signed)
Reviewed thanks! 

## 2020-09-11 NOTE — Telephone Encounter (Signed)
review 

## 2020-10-13 ENCOUNTER — Other Ambulatory Visit: Payer: Self-pay | Admitting: Psychiatry

## 2020-10-24 ENCOUNTER — Ambulatory Visit (INDEPENDENT_AMBULATORY_CARE_PROVIDER_SITE_OTHER): Payer: BC Managed Care – PPO | Admitting: Family Medicine

## 2020-10-24 ENCOUNTER — Other Ambulatory Visit: Payer: Self-pay

## 2020-10-24 ENCOUNTER — Encounter: Payer: Self-pay | Admitting: Family Medicine

## 2020-10-24 ENCOUNTER — Other Ambulatory Visit: Payer: Self-pay | Admitting: Psychiatry

## 2020-10-24 VITALS — BP 131/52 | HR 77 | Temp 98.6°F | Resp 16 | Ht 64.0 in | Wt 179.0 lb

## 2020-10-24 DIAGNOSIS — R5383 Other fatigue: Secondary | ICD-10-CM | POA: Diagnosis not present

## 2020-10-24 DIAGNOSIS — F319 Bipolar disorder, unspecified: Secondary | ICD-10-CM

## 2020-10-24 DIAGNOSIS — E039 Hypothyroidism, unspecified: Secondary | ICD-10-CM

## 2020-10-24 DIAGNOSIS — K219 Gastro-esophageal reflux disease without esophagitis: Secondary | ICD-10-CM | POA: Diagnosis not present

## 2020-10-24 DIAGNOSIS — F419 Anxiety disorder, unspecified: Secondary | ICD-10-CM

## 2020-10-24 NOTE — Patient Instructions (Signed)
The CDC recommends two doses of Shingrix (the shingles vaccine) separated by 2 to 6 months for adults age 51 years and older. I recommend checking with your insurance plan regarding coverage for this vaccine.   . Please call the [facility] [phone]) to schedule a routine screening mammogram.

## 2020-10-24 NOTE — Progress Notes (Signed)
Established patient visit   Patient: Briana Fox   DOB: 08/23/1971   49 y.o. Female  MRN: [id number] Visit Date: 10/24/2020  Today's healthcare provider: Lelon Huh, MD   Chief Complaint  Patient presents with  . Gastroesophageal Reflux  . Hypothyroidism   Subjective    HPI  Hypothyroid, follow-up  Lab Results  Component Value Date   TSH 4.420 10/24/2019   TSH 4.420 09/16/2018   TSH 2.810 05/26/2016   FREET4 1.32 10/24/2019   Wt Readings from Last 3 Encounters:  10/24/20 179 lb (81.2 kg)  04/26/20 178 lb 12.8 oz (81.1 kg)  10/18/19 180 lb (81.6 kg)    She was last seen for hypothyroid 1 years ago.  Management since that visit includes no changes. She reports good compliance with treatment. She is not having side effects.   Symptoms: No change in energy level No constipation  No diarrhea No heat / cold intolerance  No nervousness No palpitations  No weight changes    Patient reports that she needs refills on medications.   GERD, Follow up:  The patient was last seen for GERD 1 years ago. Changes made since that visit include no changes.  She reports good compliance with treatment. She is not having side effects. . She states omeprazole works well, but occasionally (one or twice a  Month) takes a second dose.  She is NOT experiencing abdominal bloating, belching, dysphagia or nausea  She states she has been very fatigued, does not sleep well at night. Attributes this to fatigue. Continues to follow up with Dr. Clovis Pu.     Medications: Outpatient Medications Prior to Visit  Medication Sig  . clonazePAM (KLONOPIN) 0.5 MG tablet Take 1 tablet (0.5 mg total) by mouth at bedtime.  . gabapentin (NEURONTIN) 300 MG capsule TAKE 1 CAPSULE BY MOUTH AT 5PM AND 1 CAPSULE AT BEDTIME  . lamoTRIgine (LAMICTAL) 150 MG tablet TAKE 1 TABLET BY MOUTH TWICE A DAY  . levothyroxine (SYNTHROID) 50 MCG tablet TAKE 1 TABLET BY MOUTH EVERY DAY ON AN EMPTY STOMACH  30-60 MINUTES BEFORE BREAKFAST  . lithium carbonate 150 MG capsule TAKE 1 CAPSULE (150 MG TOTAL) BY MOUTH EVERY EVENING.  Marland Kitchen lithium carbonate 300 MG capsule TAKE 1 CAPSULE (300 MG TOTAL) BY MOUTH 2 (TWO) TIMES DAILY WITH A MEAL.  Marland Kitchen omeprazole (PRILOSEC) 40 MG capsule TAKE 1 CAPSULE BY MOUTH EVERY DAY  . chlorpheniramine-HYDROcodone (TUSSIONEX PENNKINETIC ER) 10-8 MG/5ML SUER Take 5 mLs by mouth at bedtime as needed for cough.  Marland Kitchen CLIMARA 0.05 MG/24HR patch PLACE 1 PATCH (0.05 MG TOTAL) ONTO THE SKIN ONCE A WEEK.   No facility-administered medications prior to visit.    Review of Systems  Constitutional: Negative.   Respiratory: Negative.   Cardiovascular: Negative.   Gastrointestinal: Negative.   Endocrine: Negative.   Musculoskeletal: Negative.   Neurological: Negative.      Objective    BP (!) 131/52   Pulse 77   Temp 98.6 F (37 C)   Resp 16   Ht 5\' 4"  (1.626 m)   Wt 179 lb (81.2 kg)   BMI 30.73 kg/m   Physical Exam   General: Appearance:    Mildly obese female in no acute distress  Eyes:    PERRL, conjunctiva/corneas clear, EOM's intact       Lungs:     Clear to auscultation bilaterally, respirations unlabored  Heart:    Normal heart rate. Normal rhythm. No murmurs, rubs, or gallops.  MS:   All extremities are intact.   Neurologic:   Awake, alert, oriented x 3. No apparent focal neurological           defect.        No results found for any visits on 10/24/20.  Assessment & Plan     1. Hypothyroidism, unspecified type  - T4, free - TSH  2. Gastroesophageal reflux disease, unspecified whether esophagitis present Well controlled with omeprazole.   3. Other fatigue  - Comprehensive metabolic panel - CBC  4. Bipolar disorder, rapid cycling ([place]) Managed by Dr. Clovis Pu. Due to check  - Lithium level  5. Anxiety Continue follow up with Dr. Clovis Pu  Recommended flu vaccine which she declined.  Offered shingles vaccine which she declined.    Reminded she  is due for [facility]      The entirety of the information documented in the History of Present Illness, Review of Systems and Physical Exam were personally obtained by me. Portions of this information were initially documented by the CMA and reviewed by me for thoroughness and accuracy.      Lelon Huh, MD  [facility] [phone] (phone) [phone] (fax)  [place]

## 2020-10-25 LAB — COMPREHENSIVE METABOLIC PANEL
ALT: 13 IU/L (ref 0–32)
AST: 14 IU/L (ref 0–40)
Albumin/Globulin Ratio: 2.5 — ABNORMAL HIGH (ref 1.2–2.2)
Albumin: 4.8 g/dL (ref 3.8–4.9)
Alkaline Phosphatase: 45 IU/L (ref 44–121)
BUN/Creatinine Ratio: 31 — ABNORMAL HIGH (ref 9–23)
BUN: 20 mg/dL (ref 6–24)
Bilirubin Total: <0.2 mg/dL (ref 0.0–1.2)
CO2: 22 mmol/L (ref 20–29)
Calcium: 11 mg/dL — ABNORMAL HIGH (ref 8.7–10.2)
Chloride: 106 mmol/L (ref 96–106)
Creatinine, Ser: 0.65 mg/dL (ref 0.57–1.00)
GFR calc Af Amer: 119 mL/min/{1.73_m2} (ref >59)
GFR calc non Af Amer: 103 mL/min/{1.73_m2} (ref >59)
Globulin, Total: 1.9 g/dL (ref 1.5–4.5)
Glucose: 94 mg/dL (ref 65–99)
Potassium: 4.5 mmol/L (ref 3.5–5.2)
Sodium: 141 mmol/L (ref 134–144)
Total Protein: 6.7 g/dL (ref 6.0–8.5)

## 2020-10-25 LAB — CBC
Hematocrit: 35.8 % (ref 34.0–46.6)
Hemoglobin: 11.7 g/dL (ref 11.1–15.9)
MCH: 28.3 pg (ref 26.6–33.0)
MCHC: 32.7 g/dL (ref 31.5–35.7)
MCV: 87 fL (ref 79–97)
Platelets: 312 10*3/uL (ref 150–450)
RBC: 4.13 x10E6/uL (ref 3.77–5.28)
RDW: 13.7 % (ref 11.7–15.4)
WBC: 7.4 10*3/uL (ref 3.4–10.8)

## 2020-10-25 LAB — TSH: TSH: 3.6 u[IU]/mL (ref 0.450–4.500)

## 2020-10-25 LAB — LITHIUM LEVEL: Lithium Lvl: 0.5 mmol/L (ref 0.5–1.2)

## 2020-10-25 LAB — T4, FREE: Free T4: 1.41 ng/dL (ref 0.82–1.77)

## 2020-10-25 NOTE — Telephone Encounter (Signed)
Next apt 01/30/21

## 2020-11-07 ENCOUNTER — Other Ambulatory Visit: Payer: Self-pay | Admitting: Family Medicine

## 2020-12-04 ENCOUNTER — Encounter: Payer: Self-pay | Admitting: Family Medicine

## 2020-12-09 ENCOUNTER — Other Ambulatory Visit: Payer: Self-pay | Admitting: Psychiatry

## 2021-01-10 DIAGNOSIS — R7989 Other specified abnormal findings of blood chemistry: Secondary | ICD-10-CM | POA: Diagnosis not present

## 2021-01-10 DIAGNOSIS — G2581 Restless legs syndrome: Secondary | ICD-10-CM | POA: Diagnosis not present

## 2021-01-10 DIAGNOSIS — F319 Bipolar disorder, unspecified: Secondary | ICD-10-CM | POA: Diagnosis not present

## 2021-01-10 DIAGNOSIS — E039 Hypothyroidism, unspecified: Secondary | ICD-10-CM | POA: Diagnosis not present

## 2021-01-10 DIAGNOSIS — Z9049 Acquired absence of other specified parts of digestive tract: Secondary | ICD-10-CM | POA: Insufficient documentation

## 2021-01-19 ENCOUNTER — Other Ambulatory Visit: Payer: Self-pay | Admitting: Family Medicine

## 2021-01-19 NOTE — Telephone Encounter (Signed)
Requested Prescriptions  Pending Prescriptions Disp Refills  . omeprazole (PRILOSEC) 40 MG capsule [Pharmacy Med Name: OMEPRAZOLE DR 40 MG CAPSULE] 90 capsule 0    Sig: TAKE 1 CAPSULE BY MOUTH EVERY DAY     Gastroenterology: Proton Pump Inhibitors Passed - 01/19/2021  9:20 AM      Passed - Valid encounter within last 12 months    Recent Outpatient Visits          2 months ago Hypothyroidism, unspecified type   [facility] Birdie Sons, MD   11 months ago Suspected COVID-19 virus infection   [facility] [place], [place]   1 year ago Colon cancer screening   [facility] Birdie Sons, MD   2 years ago Hypothyroidism, unspecified type   [facility] Birdie Sons, MD   3 years ago Gastroesophageal reflux disease, esophagitis presence not specified   [facility] Birdie Sons, MD      Future Appointments            In 3 months Rubie Maid, MD [facility]

## 2021-01-20 NOTE — Telephone Encounter (Signed)
Requested medications are due for refill today.  no  Requested medications are on the active medications list.  yes  Last refill. 01/19/2021  Future visit scheduled.   No  Notes to clinic.  Insurance states otc available. Alternative requested.

## 2021-01-30 ENCOUNTER — Encounter: Payer: Self-pay | Admitting: Psychiatry

## 2021-01-30 ENCOUNTER — Ambulatory Visit (INDEPENDENT_AMBULATORY_CARE_PROVIDER_SITE_OTHER): Payer: BC Managed Care – PPO | Admitting: Psychiatry

## 2021-01-30 ENCOUNTER — Other Ambulatory Visit: Payer: Self-pay

## 2021-01-30 DIAGNOSIS — F5081 Binge eating disorder: Secondary | ICD-10-CM | POA: Diagnosis not present

## 2021-01-30 DIAGNOSIS — F5105 Insomnia due to other mental disorder: Secondary | ICD-10-CM

## 2021-01-30 DIAGNOSIS — F902 Attention-deficit hyperactivity disorder, combined type: Secondary | ICD-10-CM

## 2021-01-30 DIAGNOSIS — F319 Bipolar disorder, unspecified: Secondary | ICD-10-CM

## 2021-01-30 DIAGNOSIS — Z79899 Other long term (current) drug therapy: Secondary | ICD-10-CM

## 2021-01-30 DIAGNOSIS — F411 Generalized anxiety disorder: Secondary | ICD-10-CM

## 2021-01-30 DIAGNOSIS — F4522 Body dysmorphic disorder: Secondary | ICD-10-CM

## 2021-01-30 MED ORDER — GABAPENTIN 300 MG PO CAPS: ORAL_CAPSULE | ORAL | 1 refills | Status: DC

## 2021-01-30 MED ORDER — LITHIUM CARBONATE 150 MG PO CAPS: 150.0000 mg | ORAL_CAPSULE | Freq: Every evening | ORAL | 0 refills | Status: DC

## 2021-01-30 MED ORDER — LITHIUM CARBONATE 300 MG PO CAPS
300.0000 mg | ORAL_CAPSULE | Freq: Two times a day (BID) | ORAL | 1 refills | Status: DC
Start: 2021-01-30 — End: 2021-07-22

## 2021-01-30 MED ORDER — LAMOTRIGINE 150 MG PO TABS: 150.0000 mg | ORAL_TABLET | Freq: Two times a day (BID) | ORAL | 1 refills | Status: DC

## 2021-01-30 MED ORDER — CLONAZEPAM 0.5 MG PO TABS: 0.5000 mg | ORAL_TABLET | Freq: Every day | ORAL | 5 refills | Status: DC | PRN

## 2021-01-30 NOTE — Progress Notes (Signed)
Briana Fox [id number] Jul 28, 1971 50 y.o.    Subjective:   Patient ID:  Briana Fox is a 50 y.o. (DOB 01-22-1971) female.  Chief Complaint:  Chief Complaint  Patient presents with  . Bipolar affective disorder, rapid cycling ([place])  . Follow-up    Depression        Associated symptoms include decreased concentration.  Associated symptoms include no suicidal ideas.  Past medical history includes anxiety.   Anxiety Symptoms include decreased concentration and nervous/anxious behavior. Patient reports no confusion or suicidal ideas.      Briana Fox presents to the office today for follow-up of depression and anxiety and binge eating disorder.    When seen May 09, 2019.  She had marked improvement in the past with regard to mood and binge eating with Vyvanse but could not tolerate the dry mouth and discontinued it.  So at the last appointment we prescribed Concerta as the next best most similar long-acting stimulant option.  However she called back stating she cannot afford that 1.  Therefore Ritalin-SR 20 mg 1 every morning was sent in and it didn't work.    visit was July 25, 2019 and we increased Ritalin-SR to 20 mg twice daily for residual depression, attention problems, and binge eating off label.  Didn't work out bc cost too much and it didn't help much.  seen September 2020.  No meds were changed.  She had stopped the stimulant because it was not very helpful and was expensive.  March 2021 appointment the following is noted: Getting over Covid now.  Dx 2/23 sx started like the flu. No fever.  Sx remain fatigue and cough. For the most part mood been fair and satisfactory since here.  Fine with the meds. Situation calmer with less demands on her with only usually 1 child at a time.  Helped her mood.  Some depression with situations with several deaths from the Covid.  Still dances but does it outside.  1 of the people in the group died.  Mood still with ups and  downs but not severe.  Some depression..  She thinks menopause and struggling with age bc turning 50 yo soon the age when her Mother died.  Sister claims M overdosed.  Patient doesn't know. [name] and describes anxiety as less than before mild- Moderate. Anxiety symptoms include: Excessive Worry, Panic Symptoms,.   Pt reports no sleep issues. Pt reports that appetite is good. Pt reports that energy is poor and down slightly. Concentration is poor. Forgetful.  Suicidal thoughts:  denied by patient. No med changes  08/02/2020 appointment the following is noted: Still doesn't have smell back. Lots of stress keeping 4 gkids.  But it will drop to 2 when they start school.  At times will notice feeling small amount of rage without a reason.  It's very irritating but brief.  Sometimes out of the blue. Very scared of Covid but hasn't gotten vaccine. Takes clonazepam at night mainly and asks to increase it bc initial and terminal.  Stay stressed.  My weight consumes me always and will think of it at night for sleep. Some concerns about memory. Clonazepam 0.5 mg 1/4 tablet in the morning and 1/2 tablet at night.  Can't fully stop am doses. Sleep is Ok Put on weight bc binge eating chocolate DT depression.  Binge eating is out of control.  Less on meds and less on weekends. Plan: Continue lithium 750 mg daily Continue  lamotrigine 150 mg twice daily Switch to alprazolam to improve sleep 0.25-0.5 mg HS and 0.25 mg prn anxiety Continue gabapentin 300 mg at 5 PM and nightly Add B complex vitamin for memory  01/30/2021 appointment with the following noted: Goes dancing weekly but otherwise doesn't want to leave the house. Not sure if she's scared of Covid but doesn't like leaving the house.  More down and anxious over Covid.  Knows a number of people to die.  It's drug on and on. Sleep good here lately.  Tired lately.   Keeping gkids. Taking clonazepam 0.5 mg HS and not  alprazolam.  Failed psychiatric medication trials include Trileptal, carbamazepine,  SSRIs, bupropion,  sertraline, Vraylar, Rexulti, Geodon, Latuda, Saphris, pramipexole,  Vyvanse 40 milligrams effective but dry mouth.  Ritalin SR 40 NR.   She also refuses meds that have weight gain risk.   Has tried propranolol prn for anxiety and can tell a touch of difference at 40 mg/D. Review of Systems:  Review of Systems  Respiratory: Negative for cough.   Gastrointestinal: Positive for abdominal pain.  Musculoskeletal: Positive for arthralgias.  Neurological: Negative for tremors and weakness.  Psychiatric/Behavioral: Positive for decreased concentration and depression. Negative for agitation, behavioral problems, confusion, dysphoric mood, hallucinations, self-injury, sleep disturbance and suicidal ideas. The patient is nervous/anxious. The patient is not hyperactive.     Medications: I have reviewed the patient's current medications.  Current Outpatient Medications  Medication Sig Dispense Refill  . clonazePAM (KLONOPIN) 0.5 MG tablet TAKE 1 TABLET BY MOUTH EVERY DAY AS NEEDED 30 tablet 2  . gabapentin (NEURONTIN) 300 MG capsule TAKE 1 CAPSULE BY MOUTH AT 5PM AND 1 CAPSULE AT BEDTIME 180 capsule 1  . lamoTRIgine (LAMICTAL) 150 MG tablet TAKE 1 TABLET BY MOUTH TWICE A DAY 180 tablet 1  . levothyroxine (SYNTHROID) 50 MCG tablet TAKE 1 TABLET BY MOUTH EVERY DAY ON AN EMPTY STOMACH 30-60 MINUTES BEFORE BREAKFAST 90 tablet 3  . lithium carbonate 150 MG capsule TAKE 1 CAPSULE (150 MG TOTAL) BY MOUTH EVERY EVENING. 90 capsule 0  . lithium carbonate 300 MG capsule TAKE 1 CAPSULE (300 MG TOTAL) BY MOUTH 2 (TWO) TIMES DAILY WITH A MEAL. 180 capsule 1  . chlorpheniramine-HYDROcodone (TUSSIONEX PENNKINETIC ER) 10-8 MG/5ML SUER Take 5 mLs by mouth at bedtime as needed for cough. (Patient not taking: Reported on 01/30/2021) 115 mL 0  . CLIMARA 0.05 MG/24HR patch PLACE 1 PATCH (0.05 MG TOTAL) ONTO THE SKIN ONCE  A WEEK. (Patient not taking: Reported on 01/30/2021) 12 patch 3  . omeprazole (PRILOSEC) 40 MG capsule TAKE 1 CAPSULE BY MOUTH EVERY DAY (Patient not taking: Reported on 01/30/2021) 90 capsule 0   No current facility-administered medications for this visit.    Medication Side Effects: dry mouth.  Allergies: No Known Allergies  Past Medical History:  Diagnosis Date  . Bipolar 1 disorder ([place])   . Cardiac arrhythmia   . Depression   . Endometriosis   . Gestational diabetes   . Overweight   . RLS (restless legs syndrome)     Family History  Problem Relation Age of Onset  . Heart disease Mother   . Rheum arthritis Mother   . Diabetes Father   . Endometriosis Sister   . Diabetes Sister   . Breast cancer Maternal Aunt   . Breast cancer Maternal Grandmother   . Colon cancer Neg Hx   . Ovarian cancer Neg Hx     Social History   Socioeconomic History  .  Marital status: Married    Spouse name: Not on file  . Number of children: 2  . Years of education: Not on file  . 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 Smoking status: Never Smoker  . Smokeless tobacco: Never Used  Vaping Use  . Vaping Use: Never used  Substance and Sexual Activity  . Alcohol use: Yes    Alcohol/week: 0.0 standard drinks    Comment: occas  . Drug use: No  . Sexual activity: Yes    Birth control/protection: Surgical  Other Topics Concern  . Not on file  Social History Narrative  . Not on file   Social Determinants of Health   Financial Resource Strain: Not on file  Food Insecurity: Not on file  Transportation Needs: Not on file  Physical Activity: Not on file  Stress: Not on file  Social Connections: Not on file  Intimate Partner Violence: Not on file    Past Medical History, Surgical history, Social history, and Family history were reviewed and updated as appropriate.   Caffeine 6 cups/D  bc I'm sleepy.  Please see review of systems for further details on the patient's review from today.   Objective:   Physical Exam:  There were no vitals taken for this visit.  Physical Exam Neurological:     Mental Status: She is alert and oriented to person, place, and time.     Cranial Nerves: No dysarthria.  Psychiatric:        Attention and Perception: Attention normal. She does not perceive auditory hallucinations.        Mood and Affect: Mood is anxious and depressed. Affect is not labile or tearful.        Speech: Speech normal.        Behavior: Behavior is cooperative.        Thought Content: Thought content normal. Thought content is not paranoid or delusional. Thought content does not include homicidal or suicidal ideation. Thought content does not include homicidal or suicidal plan.        Cognition and Memory: Cognition and memory normal.        Judgment: Judgment normal.     Comments: Insight fair.  Residual depression and anxiety maybe worse with Covid lockdowns        Lab Review:     Component Value Date/Time   NA 141 10/24/2020 0858   K 4.5 10/24/2020 0858   CL 106 10/24/2020 0858   CO2 22 10/24/2020 0858   GLUCOSE 94 10/24/2020 0858   BUN 20 10/24/2020 0858   CREATININE 0.65 10/24/2020 0858   CALCIUM 11.0 (H) 10/24/2020 0858   PROT 6.7 10/24/2020 0858   ALBUMIN 4.8 10/24/2020 0858   AST 14 10/24/2020 0858   ALT 13 10/24/2020 0858   ALKPHOS 45 10/24/2020 0858   BILITOT <0.2 10/24/2020 0858   GFRNONAA 103 10/24/2020 0858   GFRAA 119 10/24/2020 0858       Component Value Date/Time   WBC 7.4 10/24/2020 0858   RBC 4.13 10/24/2020 0858   HGB 11.7 10/24/2020 0858   HCT 35.8 10/24/2020 0858   PLT 312 10/24/2020 0858   MCV 87 10/24/2020 0858   MCH 28.3 10/24/2020 0858   MCHC 32.7 10/24/2020 0858   RDW 13.7 10/24/2020 0858    Lithium Lvl  Date Value Ref Range Status  10/24/2020 0.5 0.5 - 1.2 mmol/L  Final    Comment:    Plasma concentration of  0.5 - 0.8 mmol/L are advised for long-term use; concentrations of up to 1.2 mmol/L may be necessary during acute treatment.                                  Detection Limit = 0.1                           <0.1 indicates None Detected      No results found for: PHENYTOIN, PHENOBARB, VALPROATE, CBMZ   .res Assessment: Plan:    Briana Fox was seen today for bipolar affective disorder, rapid cycling (hcc) and follow-up.  Diagnoses and all orders for this visit:  Bipolar affective disorder, rapid cycling (HCC)  Generalized anxiety disorder  Binge eating disorder  Attention deficit hyperactivity disorder (ADHD), combined type  Body image disorder  Insomnia due to mental condition  Lithium use   Greater than 50% of face to face time with patient was spent on counseling and coordination of care. 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  Failed multiple other meds.  Off label stimulant Vyvanse for depression helped mood and  the binge eating which contributes to depression.    We discussed the short-term risks associated with benzodiazepines including sedation and increased fall risk among others.  Discussed long-term side effect risk including dependence, potential withdrawal symptoms, and the potential eventual dose-related risk of dementia.  She is on a very low dosage and is not having significant memory complaints.  New studies refute risk.  But disc risk short term memory trouble.  Somewhat chronically stressed situationally.  Not likely to get better with med changes.  Overall her bipolar symptoms are not extreme.  She has chronic mild rapid cycling and spends more time depressed than up.  When it is been we have been unable to completely manage this with multiple meds tried as noted above.  Overall mood and anxiety are manageable at this point.  Depression and anxiety are not gone but are manageable.  She is under a little less stress situationally which has helped.  She has a history  of some seasonal depression and she is somewhat concerned about that upcoming.  Continue lithium 750 mg daily Continue lamotrigine 150 mg twice daily Continue clonazepam 0.5 mg HS Continue gabapentin 300 mg at 5 PM and nightly Add B complex vitamin for memory  Labs from November 2021 were good.  Need to follow calcium which was a little elevated 11.0. Lithium 0.5.  FU 6 months  Lynder Parents, MD, DFAPA  Please see After Visit Summary for patient specific instructions.  Future Appointments  Date Time Provider [place]  04/30/2021  3:00 PM Rubie Maid, MD [facility] None    No orders of the defined types were placed in this encounter.     -------------------------------

## 2021-02-11 DIAGNOSIS — F319 Bipolar disorder, unspecified: Secondary | ICD-10-CM | POA: Diagnosis not present

## 2021-02-11 DIAGNOSIS — G2581 Restless legs syndrome: Secondary | ICD-10-CM | POA: Diagnosis not present

## 2021-02-11 DIAGNOSIS — D649 Anemia, unspecified: Secondary | ICD-10-CM | POA: Diagnosis not present

## 2021-02-11 DIAGNOSIS — Z7689 Persons encountering health services in other specified circumstances: Secondary | ICD-10-CM | POA: Diagnosis not present

## 2021-02-11 DIAGNOSIS — R7989 Other specified abnormal findings of blood chemistry: Secondary | ICD-10-CM | POA: Diagnosis not present

## 2021-02-11 DIAGNOSIS — E039 Hypothyroidism, unspecified: Secondary | ICD-10-CM | POA: Diagnosis not present

## 2021-03-03 ENCOUNTER — Other Ambulatory Visit: Payer: Self-pay | Admitting: Psychiatry

## 2021-03-03 DIAGNOSIS — F319 Bipolar disorder, unspecified: Secondary | ICD-10-CM

## 2021-03-04 NOTE — Telephone Encounter (Signed)
Hey I'm a little confused about this one.The note says she is taking 750 mg daily

## 2021-03-04 NOTE — Telephone Encounter (Signed)
Please review

## 2021-04-29 NOTE — Progress Notes (Deleted)
Pt present for annual exam.  

## 2021-04-29 NOTE — Patient Instructions (Incomplete)
Preventive Care 52-52 Years Old, Female Preventive care refers to lifestyle choices and visits with your health care provider that can promote health and wellness. This includes:  A yearly physical exam. This is also called an annual wellness visit.  Regular dental and eye exams.  Immunizations.  Screening for certain conditions.  Healthy lifestyle choices, such as: ? Eating a healthy diet. ? Getting regular exercise. ? Not using drugs or products that contain nicotine and tobacco. ? Limiting alcohol use. What can I expect for my preventive care visit? Physical exam Your health care provider will check your:  Height and weight. These may be used to calculate your BMI (body mass index). BMI is a measurement that tells if you are at a healthy weight.  Heart rate and blood pressure.  Body temperature.  Skin for abnormal spots. Counseling Your health care provider may ask you questions about your:  Past medical problems.  Family's medical history.  Alcohol, tobacco, and drug use.  Emotional well-being.  Home life and relationship well-being.  Sexual activity.  Diet, exercise, and sleep habits.  Work and work Statistician.  Access to firearms.  Method of birth control.  Menstrual cycle.  Pregnancy history. What immunizations do I need? Vaccines are usually given at various ages, according to a schedule. Your health care provider will recommend vaccines for you based on your age, medical history, and lifestyle or other factors, such as travel or where you work.   What tests do I need? Blood tests  Lipid and cholesterol levels. These may be checked every 5 years, or more often if you are over 3 years old.  Hepatitis C test.  Hepatitis B test. Screening  Lung cancer screening. You may have this screening every year starting at age 52 if you have a 30-pack-year history of smoking and currently smoke or have quit within the past 15 years.  Colorectal cancer  screening. ? All adults should have this screening starting at age 52 and continuing until age 17. ? Your health care provider may recommend screening at age 49 if you are at 52 increased risk. ? You will have tests every 1-10 years, depending on your results and the type of screening test.  Diabetes screening. ? This is done by checking your blood sugar (glucose) after you have not eaten for a while (fasting). ? You may have this done every 1-3 years.  Mammogram. ? This may be done every 1-2 years. ? Talk with your health care provider about when you should start having regular mammograms. This may depend on whether you have a family history of breast cancer.  BRCA-related cancer screening. This may be done if you have a family history of breast, ovarian, tubal, or peritoneal cancers.  Pelvic exam and Pap test. ? This may be done every 3 years starting at age 52. ? Starting at age 52, this may be done every 5 years if you have a Pap test in combination with an HPV test. Other tests  STD (sexually transmitted disease) testing, if you are at risk.  Bone density scan. This is done to screen for osteoporosis. You may have this scan if you are at high risk for osteoporosis. Talk with your health care provider about your test results, treatment options, and if necessary, the need for more tests. Follow these instructions at home: Eating and drinking  Eat a diet that includes fresh fruits and vegetables, whole grains, lean protein, and low-fat dairy products.  Take vitamin and mineral supplements  as recommended by your health care provider.  Do not drink alcohol if: ? Your health care provider tells you not to drink. ? You are pregnant, may be pregnant, or are planning to become pregnant.  If you drink alcohol: ? Limit how much you have to 0-1 drink a day. ? Be aware of how much alcohol is in your drink. In the [place] one drink equals one 12 oz bottle of beer (355 mL), one 5 oz glass of  wine (148 mL), or one 1 oz glass of hard liquor (44 mL).   Lifestyle  Take daily care of your teeth and gums. Brush your teeth every morning and night with fluoride toothpaste. Floss one time each day.  Stay active. Exercise for at least 30 minutes 5 or more days each week.  Do not use any products that contain nicotine or tobacco, such as cigarettes, e-cigarettes, and chewing tobacco. If you need help quitting, ask your health care provider.  Do not use drugs.  If you are sexually active, practice safe sex. Use a condom or other form of protection to prevent STIs (sexually transmitted infections).  If you do not wish to become pregnant, use a form of birth control. If you plan to become pregnant, see your health care provider for a prepregnancy visit.  If told by your health care provider, take low-dose aspirin daily starting at age 52.  Find healthy ways to cope with stress, such as: ? Meditation, yoga, or listening to music. ? Journaling. ? Talking to a trusted person. ? Spending time with friends and family. Safety  Always wear your seat belt while driving or riding in a vehicle.  Do not drive: ? If you have been drinking alcohol. Do not ride with someone who has been drinking. ? When you are tired or distracted. ? While texting.  Wear a helmet and other protective equipment during sports activities.  If you have firearms in your house, make sure you follow all gun safety procedures. What's next?  Visit your health care provider once a year for an annual wellness visit.  Ask your health care provider how often you should have your eyes and teeth checked.  Stay up to date on all vaccines. This information is not intended to replace advice given to you by your health care provider. Make sure you discuss any questions you have with your health care provider. Document Revised: 09/11/2020 Document Reviewed: 08/19/2018 Elsevier Patient Education  2021 [place] Breast self-awareness is knowing how your breasts look and feel. Doing breast self-awareness is important. It allows you to catch a breast problem early while it is still small and can be treated. All women should do breast self-awareness, including women who have had breast implants. Tell your doctor if you notice a change in your breasts. What you need:  A mirror.  A well-lit room. How to do a breast self-exam A breast self-exam is one way to learn what is normal for your breasts and to check for changes. To do a breast self-exam: Look for changes 1. Take off all the clothes above your waist. 2. Stand in front of a mirror in a room with good lighting. 3. Put your hands on your hips. 4. Push your hands down. 5. Look at your breasts and nipples in the mirror to see if one breast or nipple looks different from the other. Check to see if: ? The shape of one breast is different. ? The size of  one breast is different. ? There are wrinkles, dips, and bumps in one breast and not the other. 6. Look at each breast for changes in the skin, such as: ? Redness. ? Scaly areas. 7. Look for changes in your nipples, such as: ? Liquid around the nipples. ? Bleeding. ? Dimpling. ? Redness. ? A change in where the nipples are.   Feel for changes 1. Lie on your back on the floor. 2. Feel each breast. To do this, follow these steps: ? Pick a breast to feel. ? Put the arm closest to that breast above your head. ? Use your other arm to feel the nipple area of your breast. Feel the area with the pads of your three middle fingers by making small circles with your fingers. For the first circle, press lightly. For the second circle, press harder. For the third circle, press even harder. ? Keep making circles with your fingers at the different pressures as you move down your breast. Stop when you feel your ribs. ? Move your fingers a little toward the center of your body. ? Start making  circles with your fingers again, this time going up until you reach your collarbone. ? Keep making up-and-down circles until you reach your armpit. Remember to keep using the three pressures. ? Feel the other breast in the same way. 3. Sit or stand in the tub or shower. 4. With soapy water on your skin, feel each breast the same way you did in step 2 when you were lying on the floor.   Write down what you find Writing down what you find can help you remember what to tell your doctor. Write down:  What is normal for each breast.  Any changes you find in each breast, including: ? The kind of changes you find. ? Whether you have pain. ? Size and location of any lumps.  When you last had your menstrual period. General tips  Check your breasts every month.  If you are breastfeeding, the best time to check your breasts is after you feed your baby or after you use a breast pump.  If you get menstrual periods, the best time to check your breasts is 5-7 days after your menstrual period is over.  With time, you will become comfortable with the self-exam, and you will begin to know if there are changes in your breasts. Contact a doctor if you:  See a change in the shape or size of your breasts or nipples.  See a change in the skin of your breast or nipples, such as red or scaly skin.  Have fluid coming from your nipples that is not normal.  Find a lump or thick area that was not there before.  Have pain in your breasts.  Have any concerns about your breast health. Summary  Breast self-awareness includes looking for changes in your breasts, as well as feeling for changes within your breasts.  Breast self-awareness should be done in front of a mirror in a well-lit room.  You should check your breasts every month. If you get menstrual periods, the best time to check your breasts is 5-7 days after your menstrual period is over.  Let your doctor know of any changes you see in your  breasts, including changes in size, changes on the skin, pain or tenderness, or fluid from your nipples that is not normal. This information is not intended to replace advice given to you by your health care provider. Make sure  you discuss any questions you have with your health care provider. Document Revised: 07/27/2018 Document Reviewed: 07/27/2018 Elsevier Patient Education  [place].

## 2021-04-30 ENCOUNTER — Encounter: Payer: BC Managed Care – PPO | Admitting: Obstetrics and Gynecology

## 2021-07-22 ENCOUNTER — Ambulatory Visit (INDEPENDENT_AMBULATORY_CARE_PROVIDER_SITE_OTHER): Payer: BC Managed Care – PPO | Admitting: Psychiatry

## 2021-07-22 ENCOUNTER — Encounter: Payer: Self-pay | Admitting: Psychiatry

## 2021-07-22 ENCOUNTER — Other Ambulatory Visit: Payer: Self-pay

## 2021-07-22 DIAGNOSIS — F4522 Body dysmorphic disorder: Secondary | ICD-10-CM

## 2021-07-22 DIAGNOSIS — F411 Generalized anxiety disorder: Secondary | ICD-10-CM | POA: Diagnosis not present

## 2021-07-22 DIAGNOSIS — F5105 Insomnia due to other mental disorder: Secondary | ICD-10-CM

## 2021-07-22 DIAGNOSIS — F319 Bipolar disorder, unspecified: Secondary | ICD-10-CM | POA: Diagnosis not present

## 2021-07-22 DIAGNOSIS — F902 Attention-deficit hyperactivity disorder, combined type: Secondary | ICD-10-CM

## 2021-07-22 DIAGNOSIS — Z79899 Other long term (current) drug therapy: Secondary | ICD-10-CM

## 2021-07-22 MED ORDER — LITHIUM CARBONATE 150 MG PO CAPS: 150.0000 mg | ORAL_CAPSULE | Freq: Every evening | ORAL | 1 refills | Status: DC

## 2021-07-22 MED ORDER — LITHIUM CARBONATE 300 MG PO CAPS: 300.0000 mg | ORAL_CAPSULE | Freq: Two times a day (BID) | ORAL | 1 refills | Status: DC

## 2021-07-22 MED ORDER — CLONAZEPAM 0.5 MG PO TABS: 0.5000 mg | ORAL_TABLET | Freq: Every day | ORAL | 5 refills | Status: DC | PRN

## 2021-07-22 MED ORDER — LAMOTRIGINE 150 MG PO TABS: 150.0000 mg | ORAL_TABLET | Freq: Two times a day (BID) | ORAL | 1 refills | Status: DC

## 2021-07-22 MED ORDER — GABAPENTIN 300 MG PO CAPS: ORAL_CAPSULE | ORAL | 1 refills | Status: DC

## 2021-07-22 NOTE — Progress Notes (Signed)
Briana Fox:[id number] [birth year] 50 y.o.    Subjective:   Patient ID:  Briana Fox is a 50 y.o. (DOB Dec 23, 1970) female.  Chief Complaint:  Chief Complaint  Patient presents with   Follow-up   Bipolar affective disorder, rapid cycling ([place])   Depression    Depression        Associated symptoms include decreased concentration.  Associated symptoms include no suicidal ideas.  Past medical history includes anxiety.   Anxiety Symptoms include decreased concentration and nervous/anxious behavior. Patient reports no confusion, palpitations or suicidal ideas.     Briana Fox presents to the office today for follow-up of depression and anxiety and binge eating disorder.    When seen May 09, 2019.  She had marked improvement in the past with regard to mood and binge eating with Vyvanse but could not tolerate the dry mouth and discontinued it.  So at the last appointment we prescribed Concerta as the next best most similar long-acting stimulant option.  However she called back stating she cannot afford that 1.  Therefore Ritalin-SR 20 mg 1 every morning was sent in and it didn't work.    visit was July 25, 2019 and we increased Ritalin-SR to 20 mg twice daily for residual depression, attention problems, and binge eating off label.  Didn't work out bc cost too much and it didn't help much.  seen September 2020.  No meds were changed.  She had stopped the stimulant because it was not very helpful and was expensive.  March 2021 appointment the following is noted: Getting over Covid now.  Dx 2/23 sx started like the flu. No fever.  Sx remain fatigue and cough. For the most part mood been fair and satisfactory since here.  Fine with the meds. Situation calmer with less demands on her with only usually 1 child at a time.  Helped her mood.  Some depression with situations with several deaths from the Covid.  Still dances but does it outside.  1 of the people in the group died.   Mood still with ups and downs but not severe.  Some depression..  She thinks menopause and struggling with age bc turning 50 yo soon the age when her Mother died.  Sister claims M overdosed.  Patient doesn't know. [name] and describes anxiety as less than before mild- Moderate. Anxiety symptoms include: Excessive Worry, Panic Symptoms,.   Pt reports no sleep issues. Pt reports that appetite is good. Pt reports that energy is poor and down slightly. Concentration is poor. Forgetful.  Suicidal thoughts:  denied by patient. No med changes  08/02/2020 appointment the following is noted: Still doesn't have smell back. Lots of stress keeping 4 gkids.  But it will drop to 2 when they start school.  At times will notice feeling small amount of rage without a reason.  It's very irritating but brief.  Sometimes out of the blue. Very scared of Covid but hasn't gotten vaccine. Takes clonazepam at night mainly and asks to increase it bc initial and terminal.  Stay stressed.  My weight consumes me always and will think of it at night for sleep. Some concerns about memory. Clonazepam 0.5 mg 1/4 tablet in the morning and 1/2 tablet at night.  Can't fully stop am doses. Sleep is Ok Put on weight bc binge eating chocolate DT depression.  Binge eating is out of control.  Less on meds and less on weekends. Plan: Continue lithium 750  mg daily Continue lamotrigine 150 mg twice daily Switch to alprazolam to improve sleep 0.25-0.5 mg HS and 0.25 mg prn anxiety Continue gabapentin 300 mg at 5 PM and nightly Add B complex vitamin for memory  01/30/2021 appointment with the following noted: Goes dancing weekly but otherwise doesn't want to leave the house. Not sure if she's scared of Covid but doesn't like leaving the house.  More down and anxious over Covid.  Knows a number of people to die.  It's drug on and on. Sleep good here lately.  Tired lately.   Keeping gkids. Taking clonazepam 0.5 mg HS  and not alprazolam.  07/22/2021 appt noted: Overall pretty good. Lost 15# but self esteem is not better.  Cut back eating and exercising.  Struggles with her sense of attractiveness.  Buys too many clothes and then doesn't like the way she looks. Drags her mood down. Been a problem for her since 2nd grade. Keeping gkids, 16 mos and gson 52 yo is difficult. D is home schooling. Less hot flashes with gabapentin  Failed psychiatric medication trials include Trileptal, carbamazepine,  SSRIs, bupropion,  sertraline, Vraylar, Rexulti, Geodon, Latuda, Saphris, pramipexole,  Vyvanse 40 milligrams effective but dry mouth.  Ritalin SR 40 NR.   She also refuses meds that have weight gain risk.   Has tried propranolol prn for anxiety and can tell a touch of difference at 40 mg/D. Review of Systems:  Review of Systems  Respiratory:  Negative for cough.   Cardiovascular:  Negative for palpitations.  Gastrointestinal:  Positive for abdominal pain.  Musculoskeletal:  Positive for arthralgias.  Neurological:  Negative for tremors and weakness.  Psychiatric/Behavioral:  Positive for decreased concentration. Negative for agitation, behavioral problems, confusion, dysphoric mood, hallucinations, self-injury, sleep disturbance and suicidal ideas. The patient is nervous/anxious. The patient is not hyperactive.    Medications: I have reviewed the patient's current medications.  Current Outpatient Medications  Medication Sig Dispense Refill   levothyroxine (SYNTHROID) 50 MCG tablet TAKE 1 TABLET BY MOUTH EVERY DAY ON AN EMPTY STOMACH 30-60 MINUTES BEFORE BREAKFAST 90 tablet 3   chlorpheniramine-HYDROcodone (TUSSIONEX PENNKINETIC ER) 10-8 MG/5ML SUER Take 5 mLs by mouth at bedtime as needed for cough. (Patient not taking: No sig reported) 115 mL 0   CLIMARA 0.05 MG/24HR patch PLACE 1 PATCH (0.05 MG TOTAL) ONTO THE SKIN ONCE A WEEK. (Patient not taking: No sig reported) 12 patch 3   clonazePAM (KLONOPIN) 0.5 MG  tablet Take 1 tablet (0.5 mg total) by mouth daily as needed. 30 tablet 5   gabapentin (NEURONTIN) 300 MG capsule 1 in evening and 1 at bedtime 180 capsule 1   lamoTRIgine (LAMICTAL) 150 MG tablet Take 1 tablet (150 mg total) by mouth 2 (two) times daily. 180 tablet 1   lithium carbonate 150 MG capsule Take 1 capsule (150 mg total) by mouth every evening. 90 capsule 1   lithium carbonate 300 MG capsule Take 1 capsule (300 mg total) by mouth 2 (two) times daily with a meal. 180 capsule 1   omeprazole (PRILOSEC) 40 MG capsule TAKE 1 CAPSULE BY MOUTH EVERY DAY (Patient not taking: No sig reported) 90 capsule 0   No current facility-administered medications for this visit.    Medication Side Effects: dry mouth.  Allergies: No Known Allergies  Past Medical History:  Diagnosis Date   Bipolar 1 disorder ([place])    Cardiac arrhythmia    Depression    Endometriosis    Gestational diabetes  Overweight    RLS (restless legs syndrome)     Family History  Problem Relation Age of Onset   Heart disease Mother    Rheum arthritis Mother    Diabetes Father    Endometriosis Sister    Diabetes Sister    Breast cancer Maternal Aunt    Breast cancer Maternal Grandmother    Colon cancer Neg Hx    Ovarian cancer Neg Hx     Social History   Socioeconomic History   Marital status: Married    Spouse name: Not on file   Number of children: 2   Years of education: Not on file   Highest education level: Not on file  Occupational History    Comment: Unemployed. 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  Tobacco Use   Smoking status: Never   Smokeless tobacco: Never  Vaping Use   Vaping Use: Never used  Substance and Sexual Activity   Alcohol use: Yes    Alcohol/week: 0.0 standard drinks    Comment: occas   Drug use: No   Sexual activity: Yes    Birth control/protection: Surgical  Other Topics Concern   Not on file  Social History Narrative   Not on file   Social Determinants of  Health   Financial Resource Strain: Not on file  Food Insecurity: Not on file  Transportation Needs: Not on file  Physical Activity: Not on file  Stress: Not on file  Social Connections: Not on file  Intimate Partner Violence: Not on file    Past Medical History, Surgical history, Social history, and Family history were reviewed and updated as appropriate.   Caffeine 6 cups/D bc I'm sleepy.  Please see review of systems for further details on the patient's review from today.   Objective:   Physical Exam:  There were no vitals taken for this visit.  Physical Exam Neurological:     Mental Status: She is alert and oriented to person, place, and time.     Cranial Nerves: No dysarthria.  Psychiatric:        Attention and Perception: Attention normal. She does not perceive auditory hallucinations.        Mood and Affect: Mood is anxious and depressed. Affect is not labile or tearful.        Speech: Speech normal.        Behavior: Behavior is cooperative.        Thought Content: Thought content normal. Thought content is not paranoid or delusional. Thought content does not include homicidal or suicidal ideation. Thought content does not include homicidal or suicidal plan.        Cognition and Memory: Cognition and memory normal.        Judgment: Judgment normal.     Comments: Insight fair.  Residual depression and anxiety but stable       Lab Review:     Component Value Date/Time   NA 141 10/24/2020 0858   K 4.5 10/24/2020 0858   CL 106 10/24/2020 0858   CO2 22 10/24/2020 0858   GLUCOSE 94 10/24/2020 0858   BUN 20 10/24/2020 0858   CREATININE 0.65 10/24/2020 0858   CALCIUM 11.0 (H) 10/24/2020 0858   PROT 6.7 10/24/2020 0858   ALBUMIN 4.8 10/24/2020 0858   AST 14 10/24/2020 0858   ALT 13 10/24/2020 0858   ALKPHOS 45 10/24/2020 0858   BILITOT <0.2 10/24/2020 0858   GFRNONAA 103 10/24/2020 0858   GFRAA 119 10/24/2020 0858  Component Value Date/Time   WBC 7.4  10/24/2020 0858   RBC 4.13 10/24/2020 0858   HGB 11.7 10/24/2020 0858   HCT 35.8 10/24/2020 0858   PLT 312 10/24/2020 0858   MCV 87 10/24/2020 0858   MCH 28.3 10/24/2020 0858   MCHC 32.7 10/24/2020 0858   RDW 13.7 10/24/2020 0858    Lithium Lvl  Date Value Ref Range Status  10/24/2020 0.5 0.5 - 1.2 mmol/L Final    Comment:    Plasma concentration of 0.5 - 0.8 mmol/L are advised for long-term use; concentrations of up to 1.2 mmol/L may be necessary during acute treatment.                                  Detection Limit = 0.1                           <0.1 indicates None Detected      No results found for: PHENYTOIN, PHENOBARB, VALPROATE, CBMZ   .res Assessment: Plan:    Eiza was seen today for follow-up, bipolar affective disorder, rapid cycling ([facility]) and depression.  Diagnoses and all orders for this visit:  Bipolar affective disorder, rapid cycling (HCC) -     Lithium level -     Basic metabolic panel -     lithium carbonate 150 MG capsule; Take 1 capsule (150 mg total) by mouth every evening. -     lithium carbonate 300 MG capsule; Take 1 capsule (300 mg total) by mouth 2 (two) times daily with a meal. -     lamoTRIgine (LAMICTAL) 150 MG tablet; Take 1 tablet (150 mg total) by mouth 2 (two) times daily.  Generalized anxiety disorder -     gabapentin (NEURONTIN) 300 MG capsule; 1 in evening and 1 at bedtime  Attention deficit hyperactivity disorder (ADHD), combined type  Body image disorder  Insomnia due to mental condition -     clonazePAM (KLONOPIN) 0.5 MG tablet; Take 1 tablet (0.5 mg total) by mouth daily as needed.  Lithium use -     Basic metabolic panel  Hypercalcemia -     Basic metabolic panel  Greater than 50% of face to face time with patient was spent on counseling and coordination of care. 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  Failed multiple other meds.    We discussed the short-term risks associated with benzodiazepines including sedation  and increased fall risk among others.  Discussed long-term side effect risk including dependence, potential withdrawal symptoms, and the potential eventual dose-related risk of dementia.  She is on a very low dosage and is not having significant memory complaints.  New studies refute risk.  But disc risk short term memory trouble.  Somewhat chronically stressed situationally.  Not likely to get better with med changes.  Have disc weight loss meds Ozempic etc.  Overall her bipolar symptoms are not extreme.  She has chronic mild rapid cycling and spends more time depressed than up.  When it is been we have been unable to completely manage this with multiple meds tried as noted above.  Overall mood and anxiety are manageable at this point.  Depression and anxiety are not gone but are manageable.  She is under a little less stress situationally which has helped.  She has a history of some seasonal depression and she is somewhat concerned about that upcoming.  Continue lithium 750 mg daily Continue lamotrigine 150 mg twice daily Continue clonazepam 0.5 mg HS Continue gabapentin 300 mg at 5 PM and nightly Add B complex vitamin for memory, never tried  Labs from November 2021 were good.  Need to follow calcium which was a little elevated 11.0. Lithium 0.5. Repeat labs  FU 6 months  Lynder Parents, MD, DFAPA  Please see After Visit Summary for patient specific instructions.  Future Appointments  Date Time Provider [facility]  08/12/2021  4:00 PM [name], PhD [facility] None  08/28/2021  9:00 AM Rubie Maid, MD [facility] None    Orders Placed This Encounter  Procedures   Lithium level   Basic metabolic panel       -------------------------------

## 2021-08-01 ENCOUNTER — Other Ambulatory Visit
Admission: RE | Admit: 2021-08-01 | Discharge: 2021-08-01 | Disposition: A | Discharge status: Home / Self Care | Payer: BC Managed Care – PPO | Source: Ambulatory Visit | Attending: Internal Medicine | Admitting: Internal Medicine

## 2021-08-01 DIAGNOSIS — E039 Hypothyroidism, unspecified: Secondary | ICD-10-CM | POA: Diagnosis not present

## 2021-08-01 DIAGNOSIS — D649 Anemia, unspecified: Secondary | ICD-10-CM | POA: Diagnosis not present

## 2021-08-01 DIAGNOSIS — F319 Bipolar disorder, unspecified: Secondary | ICD-10-CM | POA: Insufficient documentation

## 2021-08-01 DIAGNOSIS — Z13220 Encounter for screening for lipoid disorders: Secondary | ICD-10-CM | POA: Diagnosis not present

## 2021-08-01 DIAGNOSIS — K219 Gastro-esophageal reflux disease without esophagitis: Secondary | ICD-10-CM | POA: Diagnosis not present

## 2021-08-01 DIAGNOSIS — Z0000 Encounter for general adult medical examination without abnormal findings: Secondary | ICD-10-CM | POA: Insufficient documentation

## 2021-08-01 DIAGNOSIS — Z131 Encounter for screening for diabetes mellitus: Secondary | ICD-10-CM | POA: Diagnosis not present

## 2021-08-01 LAB — LITHIUM LEVEL: Lithium Lvl: 0.48 mmol/L — ABNORMAL LOW (ref 0.60–1.20)

## 2021-08-02 ENCOUNTER — Other Ambulatory Visit: Payer: Self-pay | Admitting: Internal Medicine

## 2021-08-02 DIAGNOSIS — Z1231 Encounter for screening mammogram for malignant neoplasm of breast: Secondary | ICD-10-CM

## 2021-08-12 ENCOUNTER — Other Ambulatory Visit: Payer: Self-pay

## 2021-08-12 ENCOUNTER — Ambulatory Visit (INDEPENDENT_AMBULATORY_CARE_PROVIDER_SITE_OTHER): Payer: BC Managed Care – PPO | Admitting: Psychiatry

## 2021-08-12 DIAGNOSIS — F4522 Body dysmorphic disorder: Secondary | ICD-10-CM

## 2021-08-12 DIAGNOSIS — F4010 Social phobia, unspecified: Secondary | ICD-10-CM

## 2021-08-12 DIAGNOSIS — F319 Bipolar disorder, unspecified: Secondary | ICD-10-CM | POA: Diagnosis not present

## 2021-08-12 NOTE — Progress Notes (Signed)
PROBLEM-FOCUSED INITIAL PSYCHOTHERAPY EVALUATION Luan Moore, PhD LP [facility]  Name: Briana Fox Date: 08/12/2021 Time spent: 69 min MRN: [id number] DOB: 12/27/1970 Guardian/Payee: self  PCP: Tracie Harrier, MD Documentation requested on this visit: No  PROBLEM HISTORY Reason for Visit /Presenting Problem:  Chief Complaint  Patient presents with   Establish Care   Anxiety   Depression    Narrative/History of Present Illness Referred by self for return to service for anxiety and depression.  Prior patient, several years ago.  Wants to return to self-esteem issues.  Buys clothes, gets geared up to go out, then chokes down, either runs late or begs off going out.  Loves to go dancing with husband but nervous about getting picture taken.  Uses "safety" shirts sometimes.  Gets frozen now and then about her clothing or appearance.  Has lost 20 lbs. successfully of late.  Self-image at issue since 2nd grade.  Ever since she saw a flesh roll in a 2-piece bathing suit, has felt self-conscious ever since.  Family experience, one aunt who made comments about gaining weight.  GM made a public comment about 7yo telling cousins how beautiful they were and told [name].  F was alcoholic, gave up the kids.  Pt resembles her father the most, might have had something to do with rejection.  Also as a toddler, spent a lot of time in the crib when GM in charge.  Sister went to beauty school, modeling, while M and sF spent a lot of time and energy on her modeling career.  Hx of [name] had a baby die after 3 days, and mother's reaction was to tell her she should call her sister, since she had a hard day.  Cites also a "frenemy" who gets jealous.  Turned on her once, knows she looks at the dancing pictures on social media, worries about what she thinks.    Bipolar, has some angry episodes, but finds that working out and music help.  3yo grandchild Danne Baxter is an oppositional challenge,  yelling, cursing, even saying he'll shoot people and hitting.  Will get her shaking sometimes she's so upset with him but trying to faithfully keep up with watching him.  Prior Psychiatric Assessment/Treatment:   Outpatient treatment: med management Dr. Clovis Pu, prior therapy episode Psychiatric hospitalization: unknown Psychological assessment/testing: none stated   Abuse/neglect screening: Victim of abuse:  not assessed .   Victim of neglect:  not assessed .   Perpetrator of abuse/neglect: Not assessed at this time / none suspected.   Witness / Exposure to Domestic Violence: Not assessed at this time / none suspected.   Witness to Community Violence:  Not assessed at this time / none suspected.   Protective Services Involvement: No.   Report needed: No.    Substance abuse screening: Current substance abuse: Not assessed at this time / none suspected.   History of impactful substance use/abuse: Not assessed at this time / none suspected.     FAMILY/SOCIAL HISTORY Family of origin -- as noted Family of intention/current living situation -- husband Education -- Not assessed Vocation -- Not assessed Finances -- Not assessed Spiritually -- deferred Enjoyable activities -- dancing  Other situational factors affecting treatment and prognosis: Stressors from the following areas: Marital or family conflict Barriers to service: evening appointments only due to ride Notable cultural sensitivities: none stated Strengths: Self Advocate   MED/SURG HISTORY Med/surg history was not reviewed with PT at this time.  Noted hx of hysterectomy. Past  Medical History:  Diagnosis Date   Bipolar 1 disorder ([place])    Cardiac arrhythmia    Depression    Endometriosis    Gestational diabetes    Overweight    RLS (restless legs syndrome)      Past Surgical History:  Procedure Laterality Date   BUNIONECTOMY     CHOLECYSTECTOMY     FOOT SURGERY     LAPAROSCOPY     pulmonary function test   12/24/2010   Normal   TOTAL VAGINAL HYSTERECTOMY  08/1998   tvh    No Known Allergies  Medications (as listed in Epic): Current Outpatient Medications  Medication Sig Dispense Refill   Cholecalciferol 25 MCG (1000 UT) tablet Take by mouth.     clonazePAM (KLONOPIN) 0.5 MG tablet Take 1 tablet (0.5 mg total) by mouth daily as needed. 30 tablet 5   gabapentin (NEURONTIN) 300 MG capsule 1 in evening and 1 at bedtime 180 capsule 1   lamoTRIgine (LAMICTAL) 150 MG tablet Take 1 tablet (150 mg total) by mouth 2 (two) times daily. 180 tablet 1   levothyroxine (SYNTHROID) 50 MCG tablet TAKE 1 TABLET BY MOUTH EVERY DAY ON AN EMPTY STOMACH 30-60 MINUTES BEFORE BREAKFAST 90 tablet 3   lithium carbonate 150 MG capsule Take 1 capsule (150 mg total) by mouth every evening. 90 capsule 1   lithium carbonate 300 MG capsule Take 1 capsule (300 mg total) by mouth 2 (two) times daily with a meal. 180 capsule 1   omeprazole (PRILOSEC) 40 MG capsule TAKE 1 CAPSULE BY MOUTH EVERY DAY 90 capsule 0   No current facility-administered medications for this visit.    MENTAL STATUS AND OBSERVATIONS Appearance:   Casual     Behavior:  Tentative, but warmed  Motor:  Normal  Speech/Language:   Clear and Coherent  Affect:  Constricted  Mood:  anxious  Thought process:  normal  Thought content:    Obsessions  Sensory/Perceptual disturbances:    WNL  Orientation:  Fully oriented  Attention:  Good  Concentration:  Good  Memory:  WNL  Fund of knowledge:   Fair  Insight:    Fair  Judgment:   Good  Impulse Control:  Good   Initial Risk Assessment: Danger to self: No Self-injurious behavior: No Danger to others: No Physical aggression / violence: No Duty to warn: No Access to firearms a concern: No Gang involvement: No Patient / guardian was educated about steps to take if suicide or homicide risk level increases between visits: yes While future psychiatric events cannot be accurately predicted, the patient  does not currently require acute inpatient psychiatric care and does not currently meet [facility] involuntary commitment criteria.   DIAGNOSIS:    ICD-10-CM   1. Bipolar affective disorder, rapid cycling ([place])  F31.9     2. Social anxiety disorder  F40.10     3. Body image disorder  F45.22       INITIAL TREATMENT: Support/validation provided for distressing symptoms and confirmed rapport Ethical orientation and informed consent confirmed re: privacy rights -- including but not limited to HIPAA, EMR and use of e-PHI patient responsibilities -- scheduling, fair notice of changes, in-person vs. telehealth and regulatory and financial conditions affecting choice expectations for working relationship in psychotherapy needs and consents for working partnerships and exchange of information with other health care providers, especially any medication and other behavioral health providers Initial orientation to cognitive-behavioral and solution-focused therapy approach Psychoeducation and initial recommendations: Framed task as  building up the ability to settle for "good enough" and go anyway, with wardrobe or getting out, and to get stronger at going through anyway rather than getting hung up Identified friends Shearon Stalls and [name] as people she could call to coax her through  Recommend for husband Arnette Norris in supporting her braving social anxiety -- can try empathizing and saying "This is your opportunity" when he would otherwise be frustrated Affirmed working out and music as helpful activities Outlook for therapy -- scheduling constraints, availability of crisis service, inclusion of family member(s) as appropriate  Plan: Work on "good enough" thinking about dressing for dancing and Option to reach out to [place] to coax her out Communication tips to relay to [employer] in supporting her going out Consider finding a theme song to think about in "going for it" getting out of the  house when intended Endorse exercise and sleep regulation for mood management and anxiety reduction Maintain medication as prescribed and work faithfully with relevant prescriber(s) if any changes are desired or seem indicated Call the clinic on-call service, present to ER, or call 911 if any life-threatening psychiatric crisis Return for time as available.  [name], PhD  Luan Moore, PhD LP Clinical Psychologist, [facility] [address], [place], [place] [address] [phone]

## 2021-08-27 NOTE — Progress Notes (Signed)
GYNECOLOGY ANNUAL PHYSICAL EXAM PROGRESS NOTE  Subjective:   HPI Briana Fox is a 49 y.o. [phone] menopausal female here for a routine annual gynecologic exam. The patient is not currently sexually active.  Patient denies post-menopausal vaginal bleeding. The patient wears seatbelts: yes. The patient participates in regular exercise: yes. Has the patient ever been transfused or tattooed?: no. The patient reports that there is not domestic violence in her life.   Briana Fox has the following complaints today:  Still noting some hot flushes and night sweats. No longer sexually active due to dyspareunia.  Briana Fox ~ 2 years ago but did not like it because it was "messy". Stopped after 2 weeks of use.  Attempted to use Climara patches last year but notes that they would not stick to her skin and would fall off. Does note that Gabapentin helps some with the hot flushes and sweats.  Reports that she has lost weight since her last annual exam. Has been modifying diet and actively trying to exercise. Has lost ~ 20 lbs so far.    Gynecologic History No LMP recorded. Patient has had a hysterectomy. Contraception: post menopausal status Last Pap: 1999. Results were: normal Last mammogram: 01/085/2020.  Results were: normal Last Colonoscopy: patient has never had a colonoscopy. Performed Cologuard last year, was negative.  Last Dexa Scan: Patient has never had one    OB History  Gravida Para Term Preterm AB Living  '3 3 3 '$ 0 0 3  SAB IAB Ectopic Multiple Live Births  0 0 0 0 3    # Outcome Date GA Lbr Len/2nd Weight Sex Delivery Anes PTL Lv  3 Term      Vag-Spont   LIV  2 Term      Vag-Spont   LIV  1 Term      Vag-Spont   LIV    Past Medical History:  Diagnosis Date   Bipolar 1 disorder ([place])    Cardiac arrhythmia    Depression    Endometriosis    Gestational diabetes    Overweight    RLS (restless legs syndrome)     Past Surgical History:  Procedure Laterality Date    BUNIONECTOMY     CHOLECYSTECTOMY     FOOT SURGERY     LAPAROSCOPY     pulmonary function test  12/24/2010   Normal   TOTAL VAGINAL HYSTERECTOMY  08/1998   tvh    Family History  Problem Relation Age of Onset   Heart disease Mother    Rheum arthritis Mother    Diabetes Father    Endometriosis Sister    Diabetes Sister    Breast cancer Maternal Aunt    Breast cancer Maternal Grandmother    Colon cancer Neg Hx    Ovarian cancer Neg Hx     Social History   Socioeconomic History   Marital status: Married    Spouse name: Not on file   Number of children: 2   Years of education: Not on file   Highest education level: Not on file  Occupational History    Comment: Unemployed. 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  Tobacco Use   Smoking status: Never   Smokeless tobacco: Never  Vaping Use   Vaping Use: Never used  Substance and Sexual Activity   Alcohol use: Yes    Alcohol/week: 0.0 standard drinks    Comment: occas   Drug use: No   Sexual activity: Yes  Birth control/protection: Surgical  Other Topics Concern   Not on file  Social History Narrative   Not on file   Social Determinants of Health   Financial Resource Strain: Not on file  Food Insecurity: Not on file  Transportation Needs: Not on file  Physical Activity: Not on file  Stress: Not on file  Social Connections: Not on file  Intimate Partner Violence: Not on file    Current Outpatient Medications on File Prior to Visit  Medication Sig Dispense Refill   chlorpheniramine-HYDROcodone (TUSSIONEX PENNKINETIC ER) 10-8 MG/5ML SUER Take 5 mLs by mouth at bedtime as needed for cough. (Patient not taking: No sig reported) 115 mL 0   CLIMARA 0.05 MG/24HR patch PLACE 1 PATCH (0.05 MG TOTAL) ONTO THE SKIN ONCE A WEEK. (Patient not taking: No sig reported) 12 patch 3   clonazePAM (KLONOPIN) 0.5 MG tablet Take 1 tablet (0.5 mg total) by mouth daily as needed. 30 tablet 5   gabapentin (NEURONTIN) 300 MG capsule  1 in evening and 1 at bedtime 180 capsule 1   lamoTRIgine (LAMICTAL) 150 MG tablet Take 1 tablet (150 mg total) by mouth 2 (two) times daily. 180 tablet 1   levothyroxine (SYNTHROID) 50 MCG tablet TAKE 1 TABLET BY MOUTH EVERY DAY ON AN EMPTY STOMACH 30-60 MINUTES BEFORE BREAKFAST 90 tablet 3   lithium carbonate 150 MG capsule Take 1 capsule (150 mg total) by mouth every evening. 90 capsule 1   lithium carbonate 300 MG capsule Take 1 capsule (300 mg total) by mouth 2 (two) times daily with a meal. 180 capsule 1   omeprazole (PRILOSEC) 40 MG capsule TAKE 1 CAPSULE BY MOUTH EVERY DAY (Patient not taking: No sig reported) 90 capsule 0   No current facility-administered medications on file prior to visit.    No Known Allergies   Review of Systems Constitutional: negative for chills, fatigue, fevers and sweats.  Positive for intermittent hot flushes and night sweats.  Eyes: negative for irritation, redness and visual disturbance Ears, nose, mouth, throat, and face: negative for hearing loss, nasal congestion, snoring and tinnitus Respiratory: negative for asthma, cough, sputum Cardiovascular: negative for chest pain, dyspnea, exertional chest pressure/discomfort, irregular heart beat, palpitations and syncope Gastrointestinal: negative for abdominal pain, change in bowel habits, nausea and vomiting Genitourinary: negative for abnormal menstrual periods, genital lesions, vaginal discharge, dysuria and urinary incontinence. Positive for dyspareunia Integument/breast: negative for breast lump, breast tenderness and nipple discharge Hematologic/lymphatic: negative for bleeding and easy bruising Musculoskeletal:negative for back pain and muscle weakness Neurological: negative for dizziness, headaches, vertigo and weakness Endocrine: negative for diabetic symptoms including polydipsia, polyuria and skin dryness Allergic/Immunologic: negative for hay fever and urticaria      Objective:  Blood  pressure 114/68, pulse (!) 57, resp. rate 16, height '5\' 4"'$  (1.626 m), weight 158 lb 11.2 oz (72 kg).  Body mass index is 27.24 kg/m.   General Appearance:    Alert, cooperative, no distress, appears stated age  Head:    Normocephalic, without obvious abnormality, atraumatic  Eyes:    PERRL, conjunctiva/corneas clear, EOM's intact, both eyes  Ears:    Normal external ear canals, both ears  Nose:   Nares normal, septum midline, mucosa normal, no drainage or sinus tenderness  Throat:   Lips, mucosa, and tongue normal; teeth and gums normal  Neck:   Supple, symmetrical, trachea midline, no adenopathy; thyroid: no enlargement/tenderness/nodules; no carotid bruit or JVD  Back:     Symmetric, no curvature, ROM normal,  no CVA tenderness  Lungs:     Clear to auscultation bilaterally, respirations unlabored  Chest Wall:    No tenderness or deformity   Heart:    Regular rate and rhythm, S1 and S2 normal, no murmur, rub or gallop  Breast Exam:    No tenderness, masses, or nipple abnormality  Abdomen:     Soft, non-tender, bowel sounds active all four quadrants, no masses, no organomegaly.    Genitalia:    Pelvic:external genitalia normal, vagina without lesions, discharge, or tenderness, moderate vaginal atrophy present.  Rectovaginal septum  normal. Cervix normal in appearance, no cervical motion tenderness, no adnexal masses or tenderness.  Uterus normal size, shape, mobile, regular contours, nontender.  Rectal:    Normal external sphincter.  No hemorrhoids appreciated. Internal exam not done.   Extremities:   Extremities normal, atraumatic, no cyanosis or edema  Pulses:   2+ and symmetric all extremities  Skin:   Skin color, texture, turgor normal, no rashes or lesions  Lymph nodes:   Cervical, supraclavicular, and axillary nodes normal  Neurologic:   CNII-XII intact, normal strength, sensation and reflexes throughout   .  Labs:  Lab Results  Component Value Date   WBC 7.4 10/24/2020   HGB 11.7  10/24/2020   HCT 35.8 10/24/2020   MCV 87 10/24/2020   PLT 312 10/24/2020    Lab Results  Component Value Date   CREATININE 0.65 10/24/2020   BUN 20 10/24/2020   NA 141 10/24/2020   K 4.5 10/24/2020   CL 106 10/24/2020   CO2 22 10/24/2020    Lab Results  Component Value Date   ALT 13 10/24/2020   AST 14 10/24/2020   ALKPHOS 45 10/24/2020   BILITOT <0.2 10/24/2020    Lab Results  Component Value Date   TSH 3.600 10/24/2020     Assessment:   1. Well woman exam with routine gynecological exam   2. Overweight (BMI 25.0-29.9)   3. Breast cancer screening by mammogram   4. Vaginal atrophy   5. Dyspareunia in female   6. Menopausal vasomotor syndrome      Plan:  - Blood tests: None ordered. Up to date currently. Plans to have labs done with PCP. - Breast self exam technique reviewed and patient encouraged to perform self-exam monthly. - Contraception: status post hysterectomy. - Discussed healthy lifestyle modifications. - Mammogram ordered by PCP. Encouraged to schedule.  - Pap smear  Not applicable/ Hysterectomy . - COVID vaccination status: Declined - Discussed other management options for dyspareunia and vasomotor symptoms.  Patient ok to try Divigel. Given 1 month supply of samples in office (0.75 mg). Patient to call for prescription if desired.  Continue Gabapentin as well.  Follow up in 1 year for annual exam.   Rubie Maid, MD [facility]

## 2021-08-28 ENCOUNTER — Other Ambulatory Visit: Payer: Self-pay

## 2021-08-28 ENCOUNTER — Encounter: Payer: Self-pay | Admitting: Obstetrics and Gynecology

## 2021-08-28 ENCOUNTER — Ambulatory Visit (INDEPENDENT_AMBULATORY_CARE_PROVIDER_SITE_OTHER): Payer: BC Managed Care – PPO | Admitting: Obstetrics and Gynecology

## 2021-08-28 VITALS — BP 114/68 | HR 57 | Resp 16 | Ht 64.0 in | Wt 158.7 lb

## 2021-08-28 DIAGNOSIS — Z1231 Encounter for screening mammogram for malignant neoplasm of breast: Secondary | ICD-10-CM | POA: Diagnosis not present

## 2021-08-28 DIAGNOSIS — Z01419 Encounter for gynecological examination (general) (routine) without abnormal findings: Secondary | ICD-10-CM

## 2021-08-28 DIAGNOSIS — N941 Unspecified dyspareunia: Secondary | ICD-10-CM

## 2021-08-28 DIAGNOSIS — N952 Postmenopausal atrophic vaginitis: Secondary | ICD-10-CM | POA: Diagnosis not present

## 2021-08-28 DIAGNOSIS — N951 Menopausal and female climacteric states: Secondary | ICD-10-CM

## 2021-08-28 DIAGNOSIS — E663 Overweight: Secondary | ICD-10-CM

## 2021-08-28 NOTE — Patient Instructions (Signed)
Breast Self-Awareness Breast self-awareness is knowing how your breasts look and feel. Doing breast self-awareness is important. It allows you to catch a breast problem early while it is still small and can be treated. All women should do breast self-awareness, including women who have had breast implants. Tell your doctor if you notice a change in your breasts. What you need: A mirror. A well-lit room. How to do a breast self-exam A breast self-exam is one way to learn what is normal for your breasts and to check for changes. To do a breast self-exam: Look for changes  Take off all the clothes above your waist. Stand in front of a mirror in a room with good lighting. Put your hands on your hips. Push your hands down. Look at your breasts and nipples in the mirror to see if one breast or nipple looks different from the other. Check to see if: The shape of one breast is different. The size of one breast is different. There are wrinkles, dips, and bumps in one breast and not the other. Look at each breast for changes in the skin, such as: Redness. Scaly areas. Look for changes in your nipples, such as: Liquid around the nipples. Bleeding. Dimpling. Redness. A change in where the nipples are. Feel for changes  Lie on your back on the floor. Feel each breast. To do this, follow these steps: Pick a breast to feel. Put the arm closest to that breast above your head. Use your other arm to feel the nipple area of your breast. Feel the area with the pads of your three middle fingers by making small circles with your fingers. For the first circle, press lightly. For the second circle, press harder. For the third circle, press even harder. Keep making circles with your fingers at the different pressures as you move down your breast. Stop when you feel your ribs. Move your fingers a little toward the center of your body. Start making circles with your fingers again, this time going up until  you reach your collarbone. Keep making up-and-down circles until you reach your armpit. Remember to keep using the three pressures. Feel the other breast in the same way. Sit or stand in the tub or shower. With soapy water on your skin, feel each breast the same way you did in step 2 when you were lying on the floor. Write down what you find Writing down what you find can help you remember what to tell your doctor. Write down: What is normal for each breast. Any changes you find in each breast, including: The kind of changes you find. Whether you have pain. Size and location of any lumps. When you last had your menstrual period. General tips Check your breasts every month. If you are breastfeeding, the best time to check your breasts is after you feed your baby or after you use a breast pump. If you get menstrual periods, the best time to check your breasts is 5-7 days after your menstrual period is over. With time, you will become comfortable with the self-exam, and you will begin to know if there are changes in your breasts. Contact a doctor if you: See a change in the shape or size of your breasts or nipples. See a change in the skin of your breast or nipples, such as red or scaly skin. Have fluid coming from your nipples that is not normal. Find a lump or thick area that was not there before. Have pain in   your breasts. Have any concerns about your breast health. Summary Breast self-awareness includes looking for changes in your breasts, as well as feeling for changes within your breasts. Breast self-awareness should be done in front of a mirror in a well-lit room. You should check your breasts every month. If you get menstrual periods, the best time to check your breasts is 5-7 days after your menstrual period is over. Let your doctor know of any changes you see in your breasts, including changes in size, changes on the skin, pain or tenderness, or fluid from your nipples that is not  normal. This information is not intended to replace advice given to you by your health care provider. Make sure you discuss any questions you have with your health care provider. Document Revised: 07/27/2018 Document Reviewed: 07/27/2018 Elsevier Patient Education  2022 Elsevier Inc.    Preventive Care 40-64 Years Old, Female Preventive care refers to lifestyle choices and visits with your health care provider that can promote health and wellness. This includes: A yearly physical exam. This is also called an annual wellness visit. Regular dental and eye exams. Immunizations. Screening for certain conditions. Healthy lifestyle choices, such as: Eating a healthy diet. Getting regular exercise. Not using drugs or products that contain nicotine and tobacco. Limiting alcohol use. What can I expect for my preventive care visit? Physical exam Your health care provider will check your: Height and weight. These may be used to calculate your BMI (body mass index). BMI is a measurement that tells if you are at a healthy weight. Heart rate and blood pressure. Body temperature. Skin for abnormal spots. Counseling Your health care provider may ask you questions about your: Past medical problems. Family's medical history. Alcohol, tobacco, and drug use. Emotional well-being. Home life and relationship well-being. Sexual activity. Diet, exercise, and sleep habits. Work and work environment. Access to firearms. Method of birth control. Menstrual cycle. Pregnancy history. What immunizations do I need? Vaccines are usually given at various ages, according to a schedule. Your health care provider will recommend vaccines for you based on your age, medical history, and lifestyle or other factors, such as travel or where you work. What tests do I need? Blood tests Lipid and cholesterol levels. These may be checked every 5 years, or more often if you are over 50 years old. Hepatitis C  test. Hepatitis B test. Screening Lung cancer screening. You may have this screening every year starting at age 55 if you have a 30-pack-year history of smoking and currently smoke or have quit within the past 15 years. Colorectal cancer screening. All adults should have this screening starting at age 50 and continuing until age 75. Your health care provider may recommend screening at age 45 if you are at increased risk. You will have tests every 1-10 years, depending on your results and the type of screening test. Diabetes screening. This is done by checking your blood sugar (glucose) after you have not eaten for a while (fasting). You may have this done every 1-3 years. Mammogram. This may be done every 1-2 years. Talk with your health care provider about when you should start having regular mammograms. This may depend on whether you have a family history of breast cancer. BRCA-related cancer screening. This may be done if you have a family history of breast, ovarian, tubal, or peritoneal cancers. Pelvic exam and Pap test. This may be done every 3 years starting at age 21. Starting at age 30, this may be done every   5 years if you have a Pap test in combination with an HPV test. Other tests STD (sexually transmitted disease) testing, if you are at risk. Bone density scan. This is done to screen for osteoporosis. You may have this scan if you are at high risk for osteoporosis. Talk with your health care provider about your test results, treatment options, and if necessary, the need for more tests. Follow these instructions at home: Eating and drinking  Eat a diet that includes fresh fruits and vegetables, whole grains, lean protein, and low-fat dairy products. Take vitamin and mineral supplements as recommended by your health care provider. Do not drink alcohol if: Your health care provider tells you not to drink. You are pregnant, may be pregnant, or are planning to become pregnant. If  you drink alcohol: Limit how much you have to 0-1 drink a day. Be aware of how much alcohol is in your drink. In the [place] one drink equals one 12 oz bottle of beer (355 mL), one 5 oz glass of wine (148 mL), or one 1 oz glass of hard liquor (44 mL). Lifestyle Take daily care of your teeth and gums. Brush your teeth every morning and night with fluoride toothpaste. Floss one time each day. Stay active. Exercise for at least 30 minutes 5 or more days each week. Do not use any products that contain nicotine or tobacco, such as cigarettes, e-cigarettes, and chewing tobacco. If you need help quitting, ask your health care provider. Do not use drugs. If you are sexually active, practice safe sex. Use a condom or other form of protection to prevent STIs (sexually transmitted infections). If you do not wish to become pregnant, use a form of birth control. If you plan to become pregnant, see your health care provider for a prepregnancy visit. If told by your health care provider, take low-dose aspirin daily starting at age 50. Find healthy ways to cope with stress, such as: Meditation, yoga, or listening to music. Journaling. Talking to a trusted person. Spending time with friends and family. Safety Always wear your seat belt while driving or riding in a vehicle. Do not drive: If you have been drinking alcohol. Do not ride with someone who has been drinking. When you are tired or distracted. While texting. Wear a helmet and other protective equipment during sports activities. If you have firearms in your house, make sure you follow all gun safety procedures. What's next? Visit your health care provider once a year for an annual wellness visit. Ask your health care provider how often you should have your eyes and teeth checked. Stay up to date on all vaccines. This information is not intended to replace advice given to you by your health care provider. Make sure you discuss any questions you have  with your health care provider. Document Revised: 02/15/2021 Document Reviewed: 08/19/2018 Elsevier Patient Education  2022 Elsevier Inc.  

## 2021-09-26 ENCOUNTER — Ambulatory Visit: Payer: BC Managed Care – PPO | Admitting: Psychiatry

## 2021-11-06 ENCOUNTER — Ambulatory Visit (INDEPENDENT_AMBULATORY_CARE_PROVIDER_SITE_OTHER): Payer: BC Managed Care – PPO | Admitting: Psychiatry

## 2021-11-06 ENCOUNTER — Other Ambulatory Visit: Payer: Self-pay

## 2021-11-06 DIAGNOSIS — Z630 Problems in relationship with spouse or partner: Secondary | ICD-10-CM | POA: Diagnosis not present

## 2021-11-06 DIAGNOSIS — F319 Bipolar disorder, unspecified: Secondary | ICD-10-CM

## 2021-11-06 DIAGNOSIS — Z636 Dependent relative needing care at home: Secondary | ICD-10-CM | POA: Diagnosis not present

## 2021-11-06 DIAGNOSIS — F4010 Social phobia, unspecified: Secondary | ICD-10-CM | POA: Diagnosis not present

## 2021-11-06 DIAGNOSIS — Z780 Asymptomatic menopausal state: Secondary | ICD-10-CM

## 2021-11-06 NOTE — Progress Notes (Signed)
Psychotherapy Progress Note [facility] Luan Moore, PhD LP  Patient ID: Briana Fox [facility] "Briana Fox    MRN: [id number] Therapy format: Individual psychotherapy Date: 11/06/2021      Start: 6:08p     Stop: 6:58p     Time Spent: 50 min Location: In-person   Session narrative (presenting needs, interim history, self-report of stressors and symptoms, applications of prior therapy, status changes, and interventions made in session) 3 months since seen for restart therapy after a few years.    C/o "rage" building for maybe 6 months.  Says Briana Fox telling her the other night she hasn't done anything for the marriage, set her off.  Acknowledges repetitive stress caring for her suspected autistic grandson and his sister 5 full days a week, has lobbied daughter Briana Fox -- since July -- to find a day a week to gt them cared for another way.  Other stress with Briana Fox being sick in August, Briana Fox no help, and Briana Fox and Briana Fox looking after him a good while; now Briana Fox invalidating, claiming she didn't do anything, it was all Briana Fox.  Every weekend on the hook to carry Briana Fox to see his GF until recently.  Galling developments in Briana Fox giving her key to his house to her sister, other things reenacting the old sense of favoritism.  Sister, for her part, is deep into QAnon, doesn't pay good enough attention to things like Briana Fox having enough oxygen coming home from hospital.  Chafes about Briana Fox not giving her credit, worry about burning out just taking care of him and the kids.    C/o memory and attention issues, consistent with menopause, plus vagina dryness.  Has an estrogen cream provided but doesn't use it for having read about sinus infections and wondering if it will cause weight gain.  Has lost weight, is working out, and feels better for that.  Assured most likely a topical will not cause such symptoms.  Since met 3 months ago, has made good use of the idea to pick out a song for personal soundtrack getting ready  for dance trips, and using the song in her head has helped her well to move through her previous impasses getting dressed.    Therapeutic modalities: Cognitive Behavioral Therapy and Solution-Oriented/Positive Psychology  Mental Status/Observations:  Appearance:   Casual     Behavior:  Appropriate  Motor:  Normal  Speech/Language:   Clear and Coherent  Affect:  Appropriate  Mood:  normal and a bit irritable with subject, more assertive  Thought process:  normal  Thought content:    WNL  Sensory/Perceptual disturbances:    WNL  Orientation:  Fully oriented  Attention:  Good    Concentration:  Good  Memory:  WNL  Insight:    Good  Judgment:   Good  Impulse Control:  Good   Risk Assessment: Danger to Self: No Self-injurious Behavior: No Danger to Others: No Physical Aggression / Violence: No Duty to Warn: No Access to Firearms a concern: No  Assessment of progress:  progressing  Diagnosis:   ICD-10-CM   1. Bipolar affective disorder, rapid cycling ([place])  Briana Fox     2. Social anxiety disorder  F40.10     3. Caregiver stress  Z63.6     4. Relationship problem between partners  Z63.0      Plan:  Encourage to try estrogen cream, and give the fear of weight gain a tolerance of 3 lbs kept on for a month before deciding if it'Briana Fox  something she has to back off. Self-affirm father is biased, de-personalize feedback and treatment, affirm doing the right thing for her own good reasons taking care of him Option ask daughter to expand the corps for child care Continue with using the soundtrack to self-motivate into social anxiety situations Continuing option to reach out to friends, guide husband Other recommendations/advice as may be noted above Continue to utilize previously learned skills ad lib Maintain medication as prescribed and work faithfully with relevant prescriber(Briana Fox) if any changes are desired or seem indicated Call the clinic on-call service, 988/hotline, 911, or present  to [facility] or ER if any life-threatening psychiatric crisis Return up to 3 months, for will call, time as available. Already scheduled visit in this office 01/22/2022.  [name], PhD Luan Moore, PhD LP Clinical Psychologist, [facility] [address] [place] [employer], [place] [address] [phone]

## 2021-11-06 NOTE — Progress Notes (Deleted)
Psychotherapy Progress Note [facility] Luan Moore, PhD LP  Patient ID: Briana Fox [facility] "[name]    MRN: [id number] Therapy format: Individual psychotherapy Date: 11/06/2021      Start: 6:08p     Stop: ***:***     Time Spent: *** min Location: In-person   Session narrative (presenting needs, interim history, self-report of stressors and symptoms, applications of prior therapy, status changes, and interventions made in session) Seen 3 months ago for 1st session after a few years, with recommendation to seek frame of mind where her appearance is "good enough", get herself to go out anyway, engage moral support of two friends [place] and [name], and ask if H would be willing to remind her when timid that this is her opportunity rather than express frustration.      Therapeutic modalities: {AM:23362::"Cognitive Behavioral Therapy","Solution-Oriented/Positive Psychology"}  Mental Status/Observations:  Appearance:   {PSY:22683}     Behavior:  {PSY:21022743}  Motor:  {PSY:22302}  Speech/Language:   {PSY:22685}  Affect:  {PSY:22687}  Mood:  {PSY:31886}  Thought process:  {PSY:31888}  Thought content:    {PSY:[phone]}  Sensory/Perceptual disturbances:    {[phone]}  Orientation:  {Psych Orientation:23301::"Fully oriented"}  Attention:  {Good-Fair-Poor ratings:23770::"Good"}    Concentration:  {Good-Fair-Poor ratings:23770::"Good"}  Memory:  {PSY:5708774681}  Insight:    {Good-Fair-Poor ratings:23770::"Good"}  Judgment:   {Good-Fair-Poor ratings:23770::"Good"}  Impulse Control:  {Good-Fair-Poor ratings:23770::"Good"}   Risk Assessment: Danger to Self: {Risk:22599::"No"} Self-injurious Behavior: {Risk:22599::"No"} Danger to Others: {Risk:22599::"No"} Physical Aggression / Violence: {Risk:22599::"No"} Duty to Warn: {AMYesNo:22526::"No"} Access to Firearms a concern: {AMYesNo:22526::"No"}  Assessment of progress:   {Progress:22147::"progressing"}  Diagnosis: No diagnosis found. Plan:  *** Other recommendations/advice as may be noted above Continue to utilize previously learned skills ad lib Maintain medication as prescribed and work faithfully with relevant prescriber(s) if any changes are desired or seem indicated Call the clinic on-call service, 988/hotline, 911, or present to [facility] or ER if any life-threatening psychiatric crisis No follow-ups on file. Already scheduled visit in this office 01/22/2022.  [name], PhD Luan Moore, PhD LP Clinical Psychologist, [facility] [address], [place], [place] [address] [phone]

## 2022-01-15 ENCOUNTER — Ambulatory Visit (INDEPENDENT_AMBULATORY_CARE_PROVIDER_SITE_OTHER): Payer: BC Managed Care – PPO | Admitting: Psychiatry

## 2022-01-15 ENCOUNTER — Other Ambulatory Visit: Payer: Self-pay

## 2022-01-15 DIAGNOSIS — Z780 Asymptomatic menopausal state: Secondary | ICD-10-CM | POA: Diagnosis not present

## 2022-01-15 DIAGNOSIS — F319 Bipolar disorder, unspecified: Secondary | ICD-10-CM

## 2022-01-15 DIAGNOSIS — F4010 Social phobia, unspecified: Secondary | ICD-10-CM | POA: Diagnosis not present

## 2022-01-15 DIAGNOSIS — Z636 Dependent relative needing care at home: Secondary | ICD-10-CM

## 2022-01-15 NOTE — Progress Notes (Signed)
Psychotherapy Progress Note [facility] Briana Moore, PhD LP  Patient ID: Briana Fox [facility] "Briana Fox    MRN: [id number] Therapy format: Individual psychotherapy Date: 01/15/2022      Start: 6:09p     Stop: 6:56p     Time Spent: 47 min Location: In-person   Session narrative (presenting needs, interim history, self-report of stressors and symptoms, applications of prior therapy, status changes, and interventions made in session) Continues to move through easily getting ready to go out dancing with husband on weekends, no longer frozen by social anxiety, thoughts of her picture being taken, and compulsive wardrobe changes.    C/o menopause-related DFA, just gets uncomfortable some nights.  Substantial stress with father -- can be frequently triggered to panic over his safety.  On her birthday last week, he went unaccounted for for a few hours, at night, enough to have Briana Fox and a friend both out searching his route and putting it out on [place].  Eventually got call from F's pastor informing her he was at church.  His resistance to getting GPS tracking and apologizing or putting them all through worry.  Further wrinkles to the situation in that sister [name] -- usually the favorite -- is on the outs with him, and he does have some history of getting himself in some trouble.  Feels [name] also kind of dumped responsibility on her.  Father also still sells short what she does for him, saying she's just "the coordinator" because she isn't personally running all sorts of errands.  Discussed assessment of his condition, assurances needed, measures she can take.  Grandson still emotionally reactive, suspected autism, or at least a sensory integration problem.  53yo has been phobic about wiping his rear end, burdening her with cleaning him after toileting, but just got him to do it himself.  Encouraged good rewards for more independent behavior.  [name], [name], though  menopause has her libido down.  Briana Fox is more tired now himself, so not complaining.  Encouraged stay willing to discuss wishes with each other.  Therapeutic modalities: Cognitive Behavioral Therapy and Solution-Oriented/Positive Psychology  Mental Status/Observations:  Appearance:   Casual     Behavior:  Appropriate  Motor:  Normal  Speech/Language:   Clear and Coherent  Affect:  Appropriate  Mood:  normal  Thought process:  normal  Thought content:    WNL  Sensory/Perceptual disturbances:    WNL  Orientation:  Fully oriented  Attention:  Good    Concentration:  Fair  Memory:  WNL  Insight:    Good  Judgment:   Good  Impulse Control:  Good   Risk Assessment: Danger to Self: No Self-injurious Behavior: No Danger to Others: No Physical Aggression / Violence: No Duty to Warn: No Access to Firearms a concern: No  Assessment of progress:  progressing  Diagnosis:   ICD-10-CM   1. Bipolar affective disorder, rapid cycling ([place])  F31.9     2. Caregiver stress  Z63.6     3. Social anxiety disorder  F40.10    much improved     4. 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  Option [employer] for guidance and support if desired.  Take discrediting commentary with a grain of salt. Keep open communication with husband for marital satisfaction and to prevent stray suspicions on his part Other recommendations/advice as may be noted above Continue to utilize  previously learned skills ad lib Maintain medication as prescribed and work faithfully with relevant prescriber(s) if any changes are desired or seem indicated Call the clinic on-call service, 988/hotline, 911, or present to [facility] or ER if any life-threatening psychiatric crisis Return in about 3 months (around 04/15/2022) for will call. Already scheduled visit in this office 01/22/2022.  [name], PhD Briana Moore, PhD LP Clinical Psychologist, [facility] [address] [place], [place] [address] [phone]

## 2022-01-22 ENCOUNTER — Other Ambulatory Visit: Payer: Self-pay

## 2022-01-22 ENCOUNTER — Encounter: Payer: Self-pay | Admitting: Psychiatry

## 2022-01-22 ENCOUNTER — Ambulatory Visit (INDEPENDENT_AMBULATORY_CARE_PROVIDER_SITE_OTHER): Payer: BC Managed Care – PPO | Admitting: Psychiatry

## 2022-01-22 DIAGNOSIS — F902 Attention-deficit hyperactivity disorder, combined type: Secondary | ICD-10-CM

## 2022-01-22 DIAGNOSIS — F5105 Insomnia due to other mental disorder: Secondary | ICD-10-CM

## 2022-01-22 DIAGNOSIS — F319 Bipolar disorder, unspecified: Secondary | ICD-10-CM | POA: Diagnosis not present

## 2022-01-22 DIAGNOSIS — F4010 Social phobia, unspecified: Secondary | ICD-10-CM

## 2022-01-22 DIAGNOSIS — Z79899 Other long term (current) drug therapy: Secondary | ICD-10-CM

## 2022-01-22 DIAGNOSIS — F411 Generalized anxiety disorder: Secondary | ICD-10-CM | POA: Diagnosis not present

## 2022-01-22 DIAGNOSIS — F5081 Binge eating disorder: Secondary | ICD-10-CM

## 2022-01-22 MED ORDER — LITHIUM CARBONATE 300 MG PO CAPS: 300.0000 mg | ORAL_CAPSULE | Freq: Two times a day (BID) | ORAL | 1 refills | Status: DC

## 2022-01-22 MED ORDER — CLONAZEPAM 0.5 MG PO TABS: 0.5000 mg | ORAL_TABLET | Freq: Every day | ORAL | 5 refills | Status: DC | PRN

## 2022-01-22 MED ORDER — LITHIUM CARBONATE 150 MG PO CAPS: 150.0000 mg | ORAL_CAPSULE | Freq: Every evening | ORAL | 1 refills | Status: DC

## 2022-01-22 MED ORDER — LAMOTRIGINE 150 MG PO TABS: 150.0000 mg | ORAL_TABLET | Freq: Two times a day (BID) | ORAL | 1 refills | Status: DC

## 2022-01-22 MED ORDER — GABAPENTIN 300 MG PO CAPS: ORAL_CAPSULE | ORAL | 1 refills | Status: DC

## 2022-01-22 NOTE — Progress Notes (Signed)
Briana Fox [id number] 1971-03-01 51 y.o.    Subjective:   Patient ID:  Briana Fox is a 51 y.o. (DOB 1971-10-14) female.  Chief Complaint:  Chief Complaint  Patient presents with   Follow-up    Bipolar affective disorder, rapid cycling ([place])   ADHD    Depression        Associated symptoms include decreased concentration.  Associated symptoms include no suicidal ideas.  Past medical history includes anxiety.   Anxiety Symptoms include decreased concentration and nervous/anxious behavior. Patient reports no confusion, palpitations or suicidal ideas.     Briana Fox presents to the office today for follow-up of depression and anxiety and binge eating disorder.    When seen May 09, 2019.  She had marked improvement in the past with regard to mood and binge eating with Vyvanse but could not tolerate the dry mouth and discontinued it.  So at the last appointment we prescribed Concerta as the next best most similar long-acting stimulant option.  However she called back stating she cannot afford that 1.  Therefore Ritalin-SR 20 mg 1 every morning was sent in and it didn't work.    visit was July 25, 2019 and we increased Ritalin-SR to 20 mg twice daily for residual depression, attention problems, and binge eating off label.  Didn't work out bc cost too much and it didn't help much.  seen September 2020.  No meds were changed.  She had stopped the stimulant because it was not very helpful and was expensive.  March 2021 appointment the following is noted: Getting over Covid now.  Dx 2/23 sx started like the flu. No fever.  Sx remain fatigue and cough. For the most part mood been fair and satisfactory since here.  Fine with the meds. Situation calmer with less demands on her with only usually 1 child at a time.  Helped her mood.  Some depression with situations with several deaths from the Covid.  Still dances but does it outside.  1 of the people in the group died.  Mood  still with ups and downs but not severe.  Some depression..  She thinks menopause and struggling with age bc turning 51 yo soon the age when her Mother died.  Sister claims M overdosed.  Patient doesn't know. [name] and describes anxiety as less than before mild- Moderate. Anxiety symptoms include: Excessive Worry, Panic Symptoms,.   Pt reports no sleep issues. Pt reports that appetite is good. Pt reports that energy is poor and down slightly. Concentration is poor. Forgetful.  Suicidal thoughts:  denied by patient. No med changes  08/02/2020 appointment the following is noted: Still doesn't have smell back. Lots of stress keeping 4 gkids.  But it will drop to 2 when they start school.  At times will notice feeling small amount of rage without a reason.  It's very irritating but brief.  Sometimes out of the blue. Very scared of Covid but hasn't gotten vaccine. Takes clonazepam at night mainly and asks to increase it bc initial and terminal.  Stay stressed.  My weight consumes me always and will think of it at night for sleep. Some concerns about memory. Clonazepam 0.5 mg 1/4 tablet in the morning and 1/2 tablet at night.  Can't fully stop am doses. Sleep is Ok Put on weight bc binge eating chocolate DT depression.  Binge eating is out of control.  Less on meds and less on weekends. Plan: Continue lithium  750 mg daily Continue lamotrigine 150 mg twice daily Switch to alprazolam to improve sleep 0.25-0.5 mg HS and 0.25 mg prn anxiety Continue gabapentin 300 mg at 5 PM and nightly Add B complex vitamin for memory  01/30/2021 appointment with the following noted: Goes dancing weekly but otherwise doesn't want to leave the house. Not sure if she's scared of Covid but doesn't like leaving the house.  More down and anxious over Covid.  Knows a number of people to die.  It's drug on and on. Sleep good here lately.  Tired lately.   Keeping gkids. Taking clonazepam 0.5 mg HS and  not alprazolam.  07/22/2021 appt noted: Overall pretty good. Lost 15# but self esteem is not better.  Cut back eating and exercising.  Struggles with her sense of attractiveness.  Buys too many clothes and then doesn't like the way she looks. Drags her mood down. Been a problem for her since 2nd grade. Keeping gkids, 16 mos and gson 53 yo is difficult. D is home schooling. Less hot flashes with gabapentin Plan: Continue lithium 750 mg daily Continue lamotrigine 150 mg twice daily Continue clonazepam 0.5 mg HS Continue gabapentin 300 mg at 5 PM and nightly Add B complex vitamin for memory, never tried  01/22/22 appt noted: Some seasonal depression.  Not severe.   No SE problems. Some chronic anxiety bu off and on and can panic over something she said or did.  Off and on all day will say she hates herself. Can't sleep without clonazepam  Failed psychiatric medication trials include Trileptal, carbamazepine,  SSRIs, bupropion,  sertraline, Vraylar, Rexulti, Geodon, Latuda, Saphris, pramipexole,  Vyvanse 40 milligrams effective but dry mouth.  Ritalin SR 40 NR.   She also refuses meds that have weight gain risk.   Has tried propranolol prn for anxiety and can tell a touch of difference at 40 mg/D. Review of Systems:  Review of Systems  Respiratory:  Negative for cough.   Cardiovascular:  Negative for palpitations.  Gastrointestinal:  Negative for abdominal pain.  Musculoskeletal:  Positive for arthralgias.  Neurological:  Negative for tremors and weakness.  Psychiatric/Behavioral:  Positive for decreased concentration. Negative for agitation, behavioral problems, confusion, dysphoric mood, hallucinations, self-injury, sleep disturbance and suicidal ideas. The patient is nervous/anxious. The patient is not hyperactive.    Medications: I have reviewed the patient's current medications.  Current Outpatient Medications  Medication Sig Dispense Refill   Cholecalciferol 25 MCG (1000 UT)  tablet Take by mouth.     levothyroxine (SYNTHROID) 50 MCG tablet TAKE 1 TABLET BY MOUTH EVERY DAY ON AN EMPTY STOMACH 30-60 MINUTES BEFORE BREAKFAST 90 tablet 3   omeprazole (PRILOSEC) 40 MG capsule TAKE 1 CAPSULE BY MOUTH EVERY DAY 90 capsule 0   clonazePAM (KLONOPIN) 0.5 MG tablet Take 1 tablet (0.5 mg total) by mouth daily as needed. 30 tablet 5   gabapentin (NEURONTIN) 300 MG capsule 1 in evening and 1 at bedtime 180 capsule 1   lamoTRIgine (LAMICTAL) 150 MG tablet Take 1 tablet (150 mg total) by mouth 2 (two) times daily. 180 tablet 1   lithium carbonate 150 MG capsule Take 1 capsule (150 mg total) by mouth every evening. 90 capsule 1   lithium carbonate 300 MG capsule Take 1 capsule (300 mg total) by mouth 2 (two) times daily with a meal. 180 capsule 1   No current facility-administered medications for this visit.    Medication Side Effects: dry mouth.  Allergies: No Known Allergies  Past  Medical History:  Diagnosis Date   Bipolar 1 disorder ([place])    Cardiac arrhythmia    Depression    Endometriosis    Gestational diabetes    Overweight    RLS (restless legs syndrome)     Family History  Problem Relation Age of Onset   Heart disease Mother    Rheum arthritis Mother    Diabetes Father    Endometriosis Sister    Diabetes Sister    Breast cancer Maternal Aunt    Breast cancer Maternal Grandmother    Colon cancer Neg Hx    Ovarian cancer Neg Hx     Social History   Socioeconomic History   Marital status: Married    Spouse name: Not on file   Number of children: 2   Years of education: Not on file   Highest education level: Not on file  Occupational History    Comment: Unemployed. 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  Tobacco Use   Smoking status: Never   Smokeless tobacco: Never  Vaping Use   Vaping Use: Never used  Substance and Sexual Activity   Alcohol use: Yes    Alcohol/week: 0.0 standard drinks    Comment: occas   Drug use: No   Sexual  activity: Yes    Birth control/protection: Surgical  Other Topics Concern   Not on file  Social History Narrative   Not on file   Social Determinants of Health   Financial Resource Strain: Not on file  Food Insecurity: Not on file  Transportation Needs: Not on file  Physical Activity: Not on file  Stress: Not on file  Social Connections: Not on file  Intimate Partner Violence: Not on file    Past Medical History, Surgical history, Social history, and Family history were reviewed and updated as appropriate.   Caffeine 6 cups/D bc I'm sleepy.  Please see review of systems for further details on the patient's review from today.   Objective:   Physical Exam:  There were no vitals taken for this visit.  Physical Exam Neurological:     Mental Status: She is alert and oriented to person, place, and time.     Cranial Nerves: No dysarthria.  Psychiatric:        Attention and Perception: Attention normal. She does not perceive auditory hallucinations.        Mood and Affect: Mood is anxious and depressed. Affect is not labile or tearful.        Speech: Speech normal.        Behavior: Behavior is cooperative.        Thought Content: Thought content normal. Thought content is not paranoid or delusional. Thought content does not include homicidal or suicidal ideation. Thought content does not include suicidal plan.        Cognition and Memory: Cognition and memory normal.        Judgment: Judgment normal.     Comments: Insight fair.  Residual depression and anxiety but stable       Lab Review:     Component Value Date/Time   NA 141 10/24/2020 0858   K 4.5 10/24/2020 0858   CL 106 10/24/2020 0858   CO2 22 10/24/2020 0858   GLUCOSE 94 10/24/2020 0858   BUN 20 10/24/2020 0858   CREATININE 0.65 10/24/2020 0858   CALCIUM 11.0 (H) 10/24/2020 0858   PROT 6.7 10/24/2020 0858   ALBUMIN 4.8 10/24/2020 0858   AST 14 10/24/2020 0858  ALT 13 10/24/2020 0858   ALKPHOS 45  10/24/2020 0858   BILITOT <0.2 10/24/2020 0858   GFRNONAA 103 10/24/2020 0858   GFRAA 119 10/24/2020 0858       Component Value Date/Time   WBC 7.4 10/24/2020 0858   RBC 4.13 10/24/2020 0858   HGB 11.7 10/24/2020 0858   HCT 35.8 10/24/2020 0858   PLT 312 10/24/2020 0858   MCV 87 10/24/2020 0858   MCH 28.3 10/24/2020 0858   MCHC 32.7 10/24/2020 0858   RDW 13.7 10/24/2020 0858    Lithium Lvl  Date Value Ref Range Status  08/01/2021 0.48 (L) 0.60 - 1.20 mmol/L Final    Comment:    Performed at [facility], [address] [place], [place] [address]     No results found for: PHENYTOIN, PHENOBARB, VALPROATE, CBMZ   .res Assessment: Plan:    Latrisa was seen today for follow-up and adhd.  Diagnoses and all orders for this visit:  Bipolar affective disorder, rapid cycling ([facility]) -     lamoTRIgine (LAMICTAL) 150 MG tablet; Take 1 tablet (150 mg total) by mouth 2 (two) times daily. -     lithium carbonate 150 MG capsule; Take 1 capsule (150 mg total) by mouth every evening. -     lithium carbonate 300 MG capsule; Take 1 capsule (300 mg total) by mouth 2 (two) times daily with a meal.  Social anxiety disorder  Generalized anxiety disorder -     gabapentin (NEURONTIN) 300 MG capsule; 1 in evening and 1 at bedtime  Attention deficit hyperactivity disorder (ADHD), combined type  Insomnia due to mental condition -     clonazePAM (KLONOPIN) 0.5 MG tablet; Take 1 tablet (0.5 mg total) by mouth daily as needed.  Hypercalcemia  Binge eating disorder  Lithium use   Greater than 50% of face to face time with patient was spent on counseling and coordination of care. 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  Failed multiple other meds.    We discussed the short-term risks associated with benzodiazepines including sedation and increased fall risk among others.  Discussed long-term side effect risk including dependence, potential withdrawal symptoms, and the potential  eventual dose-related risk of dementia.  She is on a very low dosage and is not having significant memory complaints.  New studies refute risk.  But disc risk short term memory trouble.  Somewhat chronically stressed situationally.  Not likely to get better with med changes. Need to work on positive self talk bc she demeans herself.  Have disc weight loss meds Ozempic etc.  Overall her bipolar symptoms are not extreme.  She has chronic mild rapid cycling and spends more time depressed than up.  When it is been we have been unable to completely manage this with multiple meds tried as noted above.  Overall mood and anxiety are manageable at this point.  Depression and anxiety are not gone but are manageable.  She is under a little less stress situationally which has helped.  She has a history of some seasonal depression and she is somewhat concerned about that upcoming.  Continue lithium 750 mg daily Continue lamotrigine 150 mg twice daily Continue clonazepam 0.5 mg HS Continue gabapentin 300 mg at 5 PM and nightly Add B complex vitamin for memory, never tried  Labs from 07/2021 were good.  Need to follow calcium which was a little elevated 11.0. Lithium 0.48 Repeat labs  Counseled patient regarding potential benefits, risks, and side effects of lithium  to include potential risk of lithium affecting thyroid and renal function.  Discussed need for periodic lab monitoring to determine drug level and to assess for potential adverse effects.  Counseled patient regarding signs and symptoms of lithium toxicity and advised that they notify office immediately or seek urgent medical attention if experiencing these signs and symptoms.  Patient advised to contact office with any questions or concerns. Her typical lithium levels are in the very low normal range 0.5 because that is about all she can typically tolerate.  FU 6 months  Lynder Parents, MD, DFAPA  Please see After Visit Summary for patient  specific instructions.  No future appointments.   No orders of the defined types were placed in this encounter.      -------------------------------

## 2022-04-16 ENCOUNTER — Other Ambulatory Visit: Payer: Self-pay | Admitting: Internal Medicine

## 2022-04-16 DIAGNOSIS — Z1231 Encounter for screening mammogram for malignant neoplasm of breast: Secondary | ICD-10-CM

## 2022-04-23 ENCOUNTER — Ambulatory Visit (INDEPENDENT_AMBULATORY_CARE_PROVIDER_SITE_OTHER): Payer: BC Managed Care – PPO | Admitting: Psychiatry

## 2022-04-23 DIAGNOSIS — F319 Bipolar disorder, unspecified: Secondary | ICD-10-CM

## 2022-04-23 DIAGNOSIS — Z636 Dependent relative needing care at home: Secondary | ICD-10-CM | POA: Diagnosis not present

## 2022-04-23 DIAGNOSIS — F4522 Body dysmorphic disorder: Secondary | ICD-10-CM | POA: Diagnosis not present

## 2022-04-23 DIAGNOSIS — F4010 Social phobia, unspecified: Secondary | ICD-10-CM

## 2022-04-23 NOTE — Progress Notes (Signed)
Psychotherapy Progress Note [facility] Luan Moore, PhD LP  Patient ID: Briana Fox [facility] "Briana Fox    MRN: [id number] Therapy format: Individual psychotherapy Date: 04/23/2022      Start: 6:17p     Stop: 7:03p     Time Spent: 46 min Location: In-person   Session narrative (presenting needs, interim history, self-report of stressors and symptoms, applications of prior therapy, status changes, and interventions made in session) Unhappy lately.  Getting more stressful keeping the grandkids, not doing what she wishes she was.  Tired of keeping kids, feeling stuck, with 2yo and 4yo, 4 days a week.  Irritated by menopause sxs, like breaking hair.  Still buying clothes excessively to feed her worry and second-guessing.  No c/o being unable to get out for dance nights.  Discussed habit of agonizing and worry.  Recommended set herself a timer -- 10-30 minutes -- for thinking on it to establish some stimulus control over the mindset and paradoxically undo the habit.  Establishing control over when she worries can help establish control over whether she agonizes.  If willing, share with husband.  Has gotten a police scanner, been listening to calls, learning codes.  Would like to be a dispatcher.  Thinking about doing a ridealong.  Discussed and affirmed as a possibility, find out about safety considerations.  Therapeutic modalities: Cognitive Behavioral Therapy, Solution-Oriented/Positive Psychology, and Ego-Supportive  Mental Status/Observations:  Appearance:   Casual     Behavior:  Appropriate  Motor:  Normal  Speech/Language:   Clear and Coherent  Affect:  Appropriate  Mood:  dysthymic  Thought process:  normal  Thought content:    Obsessions  Sensory/Perceptual disturbances:    WNL  Orientation:  Fully oriented  Attention:  Good    Concentration:  Good  Memory:  WNL  Insight:    Good  Judgment:   Good  Impulse Control:  Fair   Risk Assessment: Danger to  Self: No Self-injurious Behavior: No Danger to Others: No Physical Aggression / Violence: No Duty to Warn: No Access to Firearms a concern: No  Assessment of progress:  stabilized  Diagnosis:   ICD-10-CM   1. Bipolar affective disorder, rapid cycling ([place])  F31.9     2. Social anxiety disorder  F40.10     3. Caregiver stress  Z63.6     4. Body image disorder  F45.22      Plan:  Set a worry timer -- 10-30 minutes -- for thinking on it to establish some stimulus control over the mindset and paradoxically undo the habit.  If willing, share with husband. Continue moving self along through [place] for dance nights, self-reassure nobody worth caring about is scrutinizing her looks Consider working out less burdensome child care schedule with daughter Option address menopause treatment with PCP or gyn Other recommendations/advice as may be noted above Continue to utilize previously learned skills ad lib Maintain medication as prescribed and work faithfully with relevant prescriber(s) if any changes are desired or seem indicated Call the clinic on-call service, 988/hotline, 911, or present to [facility] or ER if any life-threatening psychiatric crisis Return for 1-2 months as able. Already scheduled visit in this office 07/31/2022.  [name], PhD Luan Moore, PhD LP Clinical Psychologist, [facility] [address] [place], [place] [address] [phone]

## 2022-05-21 ENCOUNTER — Ambulatory Visit
Admission: RE | Admit: 2022-05-21 | Discharge: 2022-05-21 | Disposition: A | Discharge status: Home / Self Care | Payer: BC Managed Care – PPO | Source: Ambulatory Visit | Attending: Internal Medicine | Admitting: Internal Medicine

## 2022-05-21 DIAGNOSIS — Z1231 Encounter for screening mammogram for malignant neoplasm of breast: Secondary | ICD-10-CM | POA: Insufficient documentation

## 2022-05-27 ENCOUNTER — Other Ambulatory Visit: Payer: Self-pay | Admitting: Internal Medicine

## 2022-05-27 DIAGNOSIS — R928 Other abnormal and inconclusive findings on diagnostic imaging of breast: Secondary | ICD-10-CM

## 2022-05-27 DIAGNOSIS — N6489 Other specified disorders of breast: Secondary | ICD-10-CM

## 2022-06-02 ENCOUNTER — Ambulatory Visit
Admission: RE | Admit: 2022-06-02 | Discharge: 2022-06-02 | Disposition: A | Discharge status: Home / Self Care | Payer: BC Managed Care – PPO | Source: Ambulatory Visit | Attending: Internal Medicine | Admitting: Internal Medicine

## 2022-06-02 DIAGNOSIS — N6489 Other specified disorders of breast: Secondary | ICD-10-CM

## 2022-06-02 DIAGNOSIS — R928 Other abnormal and inconclusive findings on diagnostic imaging of breast: Secondary | ICD-10-CM | POA: Diagnosis present

## 2022-07-28 ENCOUNTER — Other Ambulatory Visit: Payer: Self-pay | Admitting: Psychiatry

## 2022-07-28 DIAGNOSIS — F5105 Insomnia due to other mental disorder: Secondary | ICD-10-CM

## 2022-07-31 ENCOUNTER — Encounter: Payer: Self-pay | Admitting: Psychiatry

## 2022-07-31 ENCOUNTER — Ambulatory Visit (INDEPENDENT_AMBULATORY_CARE_PROVIDER_SITE_OTHER): Payer: BC Managed Care – PPO | Admitting: Psychiatry

## 2022-07-31 DIAGNOSIS — F4010 Social phobia, unspecified: Secondary | ICD-10-CM

## 2022-07-31 DIAGNOSIS — F319 Bipolar disorder, unspecified: Secondary | ICD-10-CM

## 2022-07-31 DIAGNOSIS — F411 Generalized anxiety disorder: Secondary | ICD-10-CM

## 2022-07-31 DIAGNOSIS — F5105 Insomnia due to other mental disorder: Secondary | ICD-10-CM

## 2022-07-31 DIAGNOSIS — F902 Attention-deficit hyperactivity disorder, combined type: Secondary | ICD-10-CM

## 2022-07-31 DIAGNOSIS — F4522 Body dysmorphic disorder: Secondary | ICD-10-CM

## 2022-07-31 DIAGNOSIS — Z79899 Other long term (current) drug therapy: Secondary | ICD-10-CM

## 2022-07-31 MED ORDER — CLONAZEPAM 0.5 MG PO TABS: 0.5000 mg | ORAL_TABLET | Freq: Every day | ORAL | 0 refills | Status: DC | PRN

## 2022-07-31 MED ORDER — LITHIUM CARBONATE 300 MG PO CAPS: 300.0000 mg | ORAL_CAPSULE | Freq: Two times a day (BID) | ORAL | 1 refills | Status: DC

## 2022-07-31 MED ORDER — LITHIUM CARBONATE 150 MG PO CAPS: 150.0000 mg | ORAL_CAPSULE | Freq: Every evening | ORAL | 1 refills | Status: DC

## 2022-07-31 MED ORDER — LAMOTRIGINE 150 MG PO TABS: 150.0000 mg | ORAL_TABLET | Freq: Two times a day (BID) | ORAL | 1 refills | Status: DC

## 2022-07-31 NOTE — Progress Notes (Signed)
Briana Fox [id number] June 03, 1971 51 y.o.    Subjective:   Patient ID:  Briana Fox is a 51 y.o. (DOB July 24, 1971) female.  Chief Complaint:  Chief Complaint  Patient presents with   Follow-up    Bipolar affective disorder, rapid cycling ([place])   Anxiety   ADHD   Depression    Depression        Associated symptoms include decreased concentration.  Associated symptoms include no suicidal ideas.  Past medical history includes anxiety.   Anxiety Symptoms include decreased concentration and nervous/anxious behavior. Patient reports no confusion, palpitations or suicidal ideas.      Briana Fox presents to the office today for follow-up of depression and anxiety and binge eating disorder.    When seen May 09, 2019.  She had marked improvement in the past with regard to mood and binge eating with Vyvanse but could not tolerate the dry mouth and discontinued it.  So at the last appointment we prescribed Concerta as the next best most similar long-acting stimulant option.  However she called back stating she cannot afford that 1.  Therefore Ritalin-SR 20 mg 1 every morning was sent in and it didn't work.    visit was July 25, 2019 and we increased Ritalin-SR to 20 mg twice daily for residual depression, attention problems, and binge eating off label.  Didn't work out bc cost too much and it didn't help much.  seen September 2020.  No meds were changed.  She had stopped the stimulant because it was not very helpful and was expensive.  March 2021 appointment the following is noted: Getting over Covid now.  Dx 2/23 sx started like the flu. No fever.  Sx remain fatigue and cough. For the most part mood been fair and satisfactory since here.  Fine with the meds. Situation calmer with less demands on her with only usually 1 child at a time.  Helped her mood.  Some depression with situations with several deaths from the Covid.  Still dances but does it outside.  1 of the people  in the group died.  Mood still with ups and downs but not severe.  Some depression..  She thinks menopause and struggling with age bc turning 51 yo soon the age when her Mother died.  Sister claims M overdosed.  Patient doesn't know. [name] and describes anxiety as less than before mild- Moderate. Anxiety symptoms include: Excessive Worry, Panic Symptoms,.   Pt reports no sleep issues. Pt reports that appetite is good. Pt reports that energy is poor and down slightly. Concentration is poor. Forgetful.  Suicidal thoughts:  denied by patient. No med changes  08/02/2020 appointment the following is noted: Still doesn't have smell back. Lots of stress keeping 4 gkids.  But it will drop to 2 when they start school.  At times will notice feeling small amount of rage without a reason.  It's very irritating but brief.  Sometimes out of the blue. Very scared of Covid but hasn't gotten vaccine. Takes clonazepam at night mainly and asks to increase it bc initial and terminal.  Stay stressed.  My weight consumes me always and will think of it at night for sleep. Some concerns about memory. Clonazepam 0.5 mg 1/4 tablet in the morning and 1/2 tablet at night.  Can't fully stop am doses. Sleep is Ok Put on weight bc binge eating chocolate DT depression.  Binge eating is out of control.  Less on meds  and less on weekends. Plan: Continue lithium 750 mg daily Continue lamotrigine 150 mg twice daily Switch to alprazolam to improve sleep 0.25-0.5 mg HS and 0.25 mg prn anxiety Continue gabapentin 300 mg at 5 PM and nightly Add B complex vitamin for memory  01/30/2021 appointment with the following noted: Goes dancing weekly but otherwise doesn't want to leave the house. Not sure if she's scared of Covid but doesn't like leaving the house.  More down and anxious over Covid.  Knows a number of people to die.  It's drug on and on. Sleep good here lately.  Tired lately.   Keeping gkids. Taking  clonazepam 0.5 mg HS and not alprazolam.  07/22/2021 appt noted: Overall pretty good. Lost 15# but self esteem is not better.  Cut back eating and exercising.  Struggles with her sense of attractiveness.  Buys too many clothes and then doesn't like the way she looks. Drags her mood down. Been a problem for her since 2nd grade. Keeping gkids, 16 mos and gson 53 yo is difficult. D is home schooling. Less hot flashes with gabapentin Plan: Continue lithium 750 mg daily Continue lamotrigine 150 mg twice daily Continue clonazepam 0.5 mg HS Continue gabapentin 300 mg at 5 PM and nightly Add B complex vitamin for memory, never tried  01/22/22 appt noted: Some seasonal depression.  Not severe.   No SE problems. Some chronic anxiety bu off and on and can panic over something she said or did.  Off and on all day will say she hates herself. Can't sleep without clonazepam  07/31/22 appt noted: Pretty good with less mood swings than before. Depression comes and goes some.  Need to resume therapy.  Situational. Sleep good with meds. Anxiety is so so.  Wouldn't go out to eat at beach.  Still goes dancing bc feels secure there but won't go out to eat with lots of people.  Self esteem issue.  Knows she needs to work on this in therapy.   No SE.   No driving in 3 years DT anxiety.  Need to start back but scared. Always issues with driving.   A couple panic for no reason but usually if unfamiliar situations. Panic over trigger of helicopter [employer] recently near bc one picked up her child that died years ago. Other things can trigger panic like smells that remind her of D that died.  Failed psychiatric medication trials include Trileptal, carbamazepine,  SSRIs, bupropion,  sertraline, Vraylar, Rexulti, Geodon, Latuda, Saphris, pramipexole,  Vyvanse 40 milligrams effective but dry mouth.  Ritalin SR 40 NR.   She also refuses meds that have weight gain risk.   Has tried propranolol prn for anxiety and can  tell a touch of difference at 40 mg/D.  Review of Systems:  Review of Systems  Cardiovascular:  Negative for palpitations.  Gastrointestinal:  Negative for abdominal pain.  Musculoskeletal:  Positive for arthralgias.  Neurological:  Negative for tremors and weakness.  Psychiatric/Behavioral:  Positive for decreased concentration and dysphoric mood. Negative for agitation, behavioral problems, confusion, hallucinations, self-injury, sleep disturbance and suicidal ideas. The patient is nervous/anxious. The patient is not hyperactive.     Medications: I have reviewed the patient's current medications.  Current Outpatient Medications  Medication Sig Dispense Refill   Cholecalciferol 25 MCG (1000 UT) tablet Take by mouth.     clonazePAM (KLONOPIN) 0.5 MG tablet TAKE 1 TABLET BY MOUTH DAILY AS NEEDED. 30 tablet 0   gabapentin (NEURONTIN) 300 MG capsule 1 in  evening and 1 at bedtime 180 capsule 1   lamoTRIgine (LAMICTAL) 150 MG tablet Take 1 tablet (150 mg total) by mouth 2 (two) times daily. 180 tablet 1   levothyroxine (SYNTHROID) 50 MCG tablet TAKE 1 TABLET BY MOUTH EVERY DAY ON AN EMPTY STOMACH 30-60 MINUTES BEFORE BREAKFAST 90 tablet 3   lithium carbonate 150 MG capsule Take 1 capsule (150 mg total) by mouth every evening. 90 capsule 1   lithium carbonate 300 MG capsule Take 1 capsule (300 mg total) by mouth 2 (two) times daily with a meal. 180 capsule 1   omeprazole (PRILOSEC) 40 MG capsule TAKE 1 CAPSULE BY MOUTH EVERY DAY 90 capsule 0   No current facility-administered medications for this visit.    Medication Side Effects: dry mouth.  Allergies: No Known Allergies  Past Medical History:  Diagnosis Date   Bipolar 1 disorder ([place])    Cardiac arrhythmia    Depression    Endometriosis    Gestational diabetes    Overweight    RLS (restless legs syndrome)     Family History  Problem Relation Age of Onset   Heart disease Mother    Rheum arthritis Mother    Diabetes Father     Endometriosis Sister    Diabetes Sister    Breast cancer Maternal Aunt    Breast cancer Maternal Grandmother    Colon cancer Neg Hx    Ovarian cancer Neg Hx     Social History   Socioeconomic History   Marital status: Married    Spouse name: Not on file   Number of children: 2   Years of education: Not on file   Highest education level: Not on file  Occupational History    Comment: Unemployed. 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  Tobacco Use   Smoking status: Never   Smokeless tobacco: Never  Vaping Use   Vaping Use: Never used  Substance and Sexual Activity   Alcohol use: Yes    Alcohol/week: 0.0 standard drinks of alcohol    Comment: occas   Drug use: No   Sexual activity: Yes    Birth control/protection: Surgical  Other Topics Concern   Not on file  Social History Narrative   Not on file   Social Determinants of Health   Financial Resource Strain: Not on file  Food Insecurity: Not on file  Transportation Needs: Not on file  Physical Activity: Not on file  Stress: Not on file  Social Connections: Not on file  Intimate Partner Violence: Not on file    Past Medical History, Surgical history, Social history, and Family history were reviewed and updated as appropriate.   Caffeine 6 cups/D bc I'm sleepy.  Please see review of systems for further details on the patient's review from today.   Objective:   Physical Exam:  There were no vitals taken for this visit.  Physical Exam Constitutional:      General: She is not in acute distress. Musculoskeletal:        General: No deformity.  Neurological:     Mental Status: She is alert and oriented to person, place, and time.     Cranial Nerves: No dysarthria.     Coordination: Coordination normal.  Psychiatric:        Attention and Perception: Attention and perception normal. She does not perceive auditory or visual hallucinations.        Mood and Affect: Mood is anxious and depressed. Affect is not  labile, blunt, angry, tearful or inappropriate.        Speech: Speech normal.        Behavior: Behavior normal. Behavior is cooperative.        Thought Content: Thought content normal. Thought content is not paranoid or delusional. Thought content does not include homicidal or suicidal ideation. Thought content does not include homicidal or suicidal plan.        Cognition and Memory: Cognition and memory normal.        Judgment: Judgment normal.     Comments: Insight fair.  Residual depression and anxiety but stable More avoidant        Lab Review:     Component Value Date/Time   NA 141 10/24/2020 0858   K 4.5 10/24/2020 0858   CL 106 10/24/2020 0858   CO2 22 10/24/2020 0858   GLUCOSE 94 10/24/2020 0858   BUN 20 10/24/2020 0858   CREATININE 0.65 10/24/2020 0858   CALCIUM 11.0 (H) 10/24/2020 0858   PROT 6.7 10/24/2020 0858   ALBUMIN 4.8 10/24/2020 0858   AST 14 10/24/2020 0858   ALT 13 10/24/2020 0858   ALKPHOS 45 10/24/2020 0858   BILITOT <0.2 10/24/2020 0858   GFRNONAA 103 10/24/2020 0858   GFRAA 119 10/24/2020 0858       Component Value Date/Time   WBC 7.4 10/24/2020 0858   RBC 4.13 10/24/2020 0858   HGB 11.7 10/24/2020 0858   HCT 35.8 10/24/2020 0858   PLT 312 10/24/2020 0858   MCV 87 10/24/2020 0858   MCH 28.3 10/24/2020 0858   MCHC 32.7 10/24/2020 0858   RDW 13.7 10/24/2020 0858    Lithium Lvl  Date Value Ref Range Status  08/01/2021 0.48 (L) 0.60 - 1.20 mmol/L Final    Comment:    Performed at [facility], [address] [place], [place] [address]     No results found for: "PHENYTOIN", "PHENOBARB", "VALPROATE", "CBMZ"   .res Assessment: Plan:    Briana Fox was seen today for follow-up, anxiety, adhd and depression.  Diagnoses and all orders for this visit:  Bipolar affective disorder, rapid cycling ([place])  Social anxiety disorder  Body image disorder  Generalized anxiety disorder  Attention deficit hyperactivity disorder (ADHD),  combined type  Insomnia due to mental condition  Lithium use  Probably PTSD too.   Greater than 50% of face to face time with patient was spent on counseling and coordination of care. 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  Failed multiple other meds.    Disc anxiety and how she's more avoidant.  Need to work on this.  We discussed the short-term risks associated with benzodiazepines including sedation and increased fall risk among others.  Discussed long-term side effect risk including dependence, potential withdrawal symptoms, and the potential eventual dose-related risk of dementia.  She is on a very low dosage and is not having significant memory complaints.  New studies refute risk.  But disc risk short term memory trouble.  Somewhat chronically stressed situationally.  Not likely to get better with med changes. Need to work on positive self talk bc she demeans herself. 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  Needs to get back to driving.  She plans ro resume therapy.  Have disc weight loss meds Ozempic etc.  Overall her bipolar symptoms are not extreme.  She has chronic mild rapid cycling and spends more time depressed than up.  When it is been we have been unable to completely manage  this with multiple meds tried as noted above.  Overall mood and anxiety are manageable at this point.  Depression and anxiety are not gone but are manageable.  She is under a little less stress situationally which has helped.  She has a history of some seasonal depression and she is somewhat concerned about that upcoming.  Continue lithium 750 mg daily Continue lamotrigine 150 mg twice daily Continue clonazepam 0.5 mg HS Continue gabapentin 300 mg at 5 PM and nightly Add B complex vitamin for memory, never tried  Labs from 07/2021 were good.  Need to follow calcium which was a little elevated 11.0. Lithium 0.48 04/15/22 normal kidney function, Calcium 10.4, normal TSH Repeat  labs  Option clonidine off label for anxiety or manic irritability  Counseled patient regarding potential benefits, risks, and side effects of lithium to include potential risk of lithium affecting thyroid and renal function.  Discussed need for periodic lab monitoring to determine drug level and to assess for potential adverse effects.  Counseled patient regarding signs and symptoms of lithium toxicity and advised that they notify office immediately or seek urgent medical attention if experiencing these signs and symptoms.  Patient advised to contact office with any questions or concerns. Her typical lithium levels are in the very low normal range 0.5 because that is about all she can typically tolerate.  No indication for med changes.  FU 6 months  Lynder Parents, MD, DFAPA  Please see After Visit Summary for patient specific instructions.  No future appointments.    No orders of the defined types were placed in this encounter.      -------------------------------

## 2022-09-07 ENCOUNTER — Other Ambulatory Visit: Payer: Self-pay | Admitting: Psychiatry

## 2022-09-07 DIAGNOSIS — F5105 Insomnia due to other mental disorder: Secondary | ICD-10-CM

## 2022-09-29 ENCOUNTER — Ambulatory Visit (INDEPENDENT_AMBULATORY_CARE_PROVIDER_SITE_OTHER): Payer: BC Managed Care – PPO | Admitting: Psychiatry

## 2022-09-29 DIAGNOSIS — F319 Bipolar disorder, unspecified: Secondary | ICD-10-CM | POA: Diagnosis not present

## 2022-09-29 DIAGNOSIS — F4522 Body dysmorphic disorder: Secondary | ICD-10-CM | POA: Diagnosis not present

## 2022-09-29 DIAGNOSIS — F411 Generalized anxiety disorder: Secondary | ICD-10-CM | POA: Diagnosis not present

## 2022-09-29 DIAGNOSIS — F4010 Social phobia, unspecified: Secondary | ICD-10-CM

## 2022-09-29 NOTE — Progress Notes (Signed)
Psychotherapy Progress Note [facility] Briana Moore, PhD LP  Patient ID: Briana Fox Briana Fox "Briana Fox    MRN: [id number] Therapy format: Family therapy w/ patient -- accompanied by Briana Fox Date: 09/29/2022      Start: 4:20p     Stop: 5:12p     Time Spent: 52 min Location: In-person   Session narrative (presenting needs, interim history, self-report of stressors and symptoms, applications of prior therapy, status changes, and interventions made in session) Back after 5 months, and typically only seen a couple times a year.  Briana Fox joining today, concerned about Briana Fox's self-esteem.    Still much focused around their weekly dance outings on Friday and Saturday, deep self-consciousness about how she looks, suspicion she's fat/ugly, and compulsion to adjust her appearance.  Wardrobe changes, shopping for other clothes, a lot of comparing herself to others and commenting negatively out loud.  Reviewed prior ideas on breaking through compulsive doubt and overtaxing procedures getting ready.  Discussed self-talk as maintaining low mood and self-image and the need to challenge and alter it.  Offered the idea of an internal voice, "Briana Fox" pointing how Briana Fox's commentary is not even close to what she would say to friends or what she would let friends say to her.  Framed her response to this self-talk as hypnotic, like OCD and checking, outcome being how it hypnotizes her into doing more and more to comply, but still robbing herself of the chance to enjoy herself.  Considered heroes for accepting their appearance, naming Briana Fox, and the caregiver for an older lady in Briana Fox.  Encouraged to imagine them as other voices or presences she could bring up and listen to to balance out Briana Fox.  Discussed scheduling, made offer to follow up, but she would prefer to go on an as-needed basis.  Encouraged to make a project of building her ability to just go on ahead entering  self-conscious situations, confront her doubts, let it be good enough, and show up for the desired or fun parts.  Idea offered to imagine Briana Fox prn either applauding any time she does show up or break through, or possibly even bursting through her door like a cartoon character to nudge her forward when needed.  Therapeutic modalities: Cognitive Behavioral Therapy and Solution-Oriented/Positive Psychology  Mental Status/Observations:  Appearance:   Casual     Behavior:  Appropriate  Motor:  Normal  Speech/Language:   Clear and Coherent  Affect:  Appropriate  Mood:  anxious and dysthymic  Thought process:  normal  Thought content:    Obsessions  Sensory/Perceptual disturbances:    WNL  Orientation:  Fully oriented  Attention:  Good    Concentration:  Good  Memory:  WNL  Insight:    Fair  Judgment:   Good  Impulse Control:  Good   Risk Assessment: Danger to Self: No Self-injurious Behavior: No Danger to Others: No Physical Aggression / Violence: No Duty to Warn: No Access to Firearms a concern: No  Assessment of progress:  stabilized  Diagnosis:   ICD-10-CM   1. Bipolar affective disorder, rapid cycling ([place])  F31.9     2. Social anxiety disorder  F40.10     3. Body image disorder  F45.22     4. Generalized anxiety disorder  F41.1      Plan:  Reframe self-criticisms of her appearance as "Briana Fox" talking, and dedicate to going ahead anyway, consulting "spirit guides" Briana Fox or the caregiver woman, and building a track record  of going in anyway and letting it be good enough.   PRN for therapy.  Option to imagine Briana Fox as momentary encourager if desired. Other recommendations/advice as may be noted above Continue to utilize previously learned skills ad lib Maintain medication as prescribed and work faithfully with relevant prescriber(s) if any changes are desired or seem indicated Call the clinic on-call service, 988/hotline, 911, or present to [facility] or ER if any  life-threatening psychiatric crisis Return if symptoms worsen or fail to improve. Already scheduled visit in this office 02/02/2023.  Briana Serve, PhD Briana Moore, PhD LP Clinical Psychologist, [facility] [address], [place], [place] [address] [phone]

## 2022-10-28 ENCOUNTER — Other Ambulatory Visit: Payer: Self-pay | Admitting: Psychiatry

## 2022-10-28 DIAGNOSIS — F5105 Insomnia due to other mental disorder: Secondary | ICD-10-CM

## 2022-10-29 NOTE — Telephone Encounter (Signed)
Filled 9/18 appt 02/02/23

## 2022-11-30 ENCOUNTER — Other Ambulatory Visit: Payer: Self-pay | Admitting: Psychiatry

## 2022-11-30 DIAGNOSIS — F411 Generalized anxiety disorder: Secondary | ICD-10-CM

## 2023-02-02 ENCOUNTER — Ambulatory Visit: Payer: BC Managed Care – PPO | Admitting: Psychiatry

## 2023-02-04 ENCOUNTER — Other Ambulatory Visit: Payer: Self-pay | Admitting: Psychiatry

## 2023-02-04 DIAGNOSIS — F5105 Insomnia due to other mental disorder: Secondary | ICD-10-CM

## 2023-02-04 DIAGNOSIS — F319 Bipolar disorder, unspecified: Secondary | ICD-10-CM

## 2023-02-16 ENCOUNTER — Ambulatory Visit (INDEPENDENT_AMBULATORY_CARE_PROVIDER_SITE_OTHER): Payer: BC Managed Care – PPO

## 2023-02-16 DIAGNOSIS — K640 First degree hemorrhoids: Secondary | ICD-10-CM | POA: Diagnosis not present

## 2023-02-16 DIAGNOSIS — K317 Polyp of stomach and duodenum: Secondary | ICD-10-CM | POA: Diagnosis not present

## 2023-02-16 DIAGNOSIS — Z1211 Encounter for screening for malignant neoplasm of colon: Secondary | ICD-10-CM

## 2023-02-16 DIAGNOSIS — K449 Diaphragmatic hernia without obstruction or gangrene: Secondary | ICD-10-CM

## 2023-02-16 DIAGNOSIS — K6389 Other specified diseases of intestine: Secondary | ICD-10-CM | POA: Diagnosis not present

## 2023-03-08 ENCOUNTER — Other Ambulatory Visit: Payer: Self-pay | Admitting: Psychiatry

## 2023-03-08 DIAGNOSIS — F319 Bipolar disorder, unspecified: Secondary | ICD-10-CM

## 2023-03-09 ENCOUNTER — Other Ambulatory Visit: Payer: Self-pay | Admitting: Psychiatry

## 2023-03-09 DIAGNOSIS — F319 Bipolar disorder, unspecified: Secondary | ICD-10-CM

## 2023-03-25 ENCOUNTER — Ambulatory Visit (INDEPENDENT_AMBULATORY_CARE_PROVIDER_SITE_OTHER): Payer: BC Managed Care – PPO | Admitting: Psychiatry

## 2023-03-25 ENCOUNTER — Encounter: Payer: Self-pay | Admitting: Psychiatry

## 2023-03-25 DIAGNOSIS — F411 Generalized anxiety disorder: Secondary | ICD-10-CM

## 2023-03-25 DIAGNOSIS — F319 Bipolar disorder, unspecified: Secondary | ICD-10-CM

## 2023-03-25 DIAGNOSIS — F4010 Social phobia, unspecified: Secondary | ICD-10-CM | POA: Diagnosis not present

## 2023-03-25 DIAGNOSIS — F5105 Insomnia due to other mental disorder: Secondary | ICD-10-CM | POA: Diagnosis not present

## 2023-03-25 MED ORDER — GABAPENTIN 300 MG PO CAPS: ORAL_CAPSULE | ORAL | 0 refills | Status: DC

## 2023-03-25 MED ORDER — LAMOTRIGINE 150 MG PO TABS: 150.0000 mg | ORAL_TABLET | Freq: Two times a day (BID) | ORAL | 0 refills | Status: DC

## 2023-03-25 MED ORDER — CLONAZEPAM 0.5 MG PO TABS: 0.5000 mg | ORAL_TABLET | Freq: Every day | ORAL | 2 refills | Status: DC | PRN

## 2023-03-25 NOTE — Patient Instructions (Addendum)
(  NAC) N-Acetylcysteine 1 of the  600 mg capsules twice dailyfor 1 week then 2 twice daily to help with mild cognitive problems and body image issues.    Auvelity 1 in the AM for 1 week then 1 twice daily for depression.  If no benefit then call back and I'll RX naltrexone for the body image issue and weight loss

## 2023-03-25 NOTE — Progress Notes (Signed)
Briana Fox [id number] 1971-06-02 52 y.o.    Subjective:   Patient ID:  Briana Fox is a 52 y.o. (DOB June 28, 1971) female.  Chief Complaint:  No chief complaint on file.   Depression        Associated symptoms include decreased concentration.  Associated symptoms include no suicidal ideas.  Past medical history includes anxiety.   Anxiety Symptoms include decreased concentration and nervous/anxious behavior. Patient reports no confusion, palpitations or suicidal ideas.      Briana Fox presents to the office today for follow-up of depression and anxiety and binge eating disorder.    When seen May 09, 2019.  She had marked improvement in the past with regard to mood and binge eating with Vyvanse but could not tolerate the dry mouth and discontinued it.  So at the last appointment we prescribed Concerta as the next best most similar long-acting stimulant option.  However she called back stating she cannot afford that 1.  Therefore Ritalin-SR 20 mg 1 every morning was sent in and it didn't work.    visit was July 25, 2019 and we increased Ritalin-SR to 20 mg twice daily for residual depression, attention problems, and binge eating off label.  Didn't work out bc cost too much and it didn't help much.  seen September 2020.  No meds were changed.  She had stopped the stimulant because it was not very helpful and was expensive.  March 2021 appointment the following is noted: Getting over Covid now.  Dx 2/23 sx started like the flu. No fever.  Sx remain fatigue and cough. For the most part mood been fair and satisfactory since here.  Fine with the meds. Situation calmer with less demands on her with only usually 1 child at a time.  Helped her mood.  Some depression with situations with several deaths from the Covid.  Still dances but does it outside.  1 of the people in the group died.  Mood still with ups and downs but not severe.  Some depression..  She thinks menopause and  struggling with age bc turning 52 yo soon the age when her Mother died.  Sister claims M overdosed.  Patient doesn't know. [name] and describes anxiety as less than before mild- Moderate. Anxiety symptoms include: Excessive Worry, Panic Symptoms,.   Pt reports no sleep issues. Pt reports that appetite is good. Pt reports that energy is poor and down slightly. Concentration is poor. Forgetful.  Suicidal thoughts:  denied by patient. No med changes  08/02/2020 appointment the following is noted: Still doesn't have smell back. Lots of stress keeping 4 gkids.  But it will drop to 2 when they start school.  At times will notice feeling small amount of rage without a reason.  It's very irritating but brief.  Sometimes out of the blue. Very scared of Covid but hasn't gotten vaccine. Takes clonazepam at night mainly and asks to increase it bc initial and terminal.  Stay stressed.  My weight consumes me always and will think of it at night for sleep. Some concerns about memory. Clonazepam 0.5 mg 1/4 tablet in the morning and 1/2 tablet at night.  Can't fully stop am doses. Sleep is Ok Put on weight bc binge eating chocolate DT depression.  Binge eating is out of control.  Less on meds and less on weekends. Plan: Continue lithium 750 mg daily Continue lamotrigine 150 mg twice daily Switch to alprazolam to improve sleep 0.25-0.5  mg HS and 0.25 mg prn anxiety Continue gabapentin 300 mg at 5 PM and nightly Add B complex vitamin for memory  01/30/2021 appointment with the following noted: Goes dancing weekly but otherwise doesn't want to leave the house. Not sure if she's scared of Covid but doesn't like leaving the house.  More down and anxious over Covid.  Knows a number of people to die.  It's drug on and on. Sleep good here lately.  Tired lately.   Keeping gkids. Taking clonazepam 0.5 mg HS and not alprazolam.  07/22/2021 appt noted: Overall pretty good. Lost 15# but self esteem  is not better.  Cut back eating and exercising.  Struggles with her sense of attractiveness.  Buys too many clothes and then doesn't like the way she looks. Drags her mood down. Been a problem for her since 2nd grade. Keeping gkids, 16 mos and gson 54 yo is difficult. D is home schooling. Less hot flashes with gabapentin Plan: Continue lithium 750 mg daily Continue lamotrigine 150 mg twice daily Continue clonazepam 0.5 mg HS Continue gabapentin 300 mg at 5 PM and nightly Add B complex vitamin for memory, never tried  01/22/22 appt noted: Some seasonal depression.  Not severe.   No SE problems. Some chronic anxiety bu off and on and can panic over something she said or did.  Off and on all day will say she hates herself. Can't sleep without clonazepam  07/31/22 appt noted: Pretty good with less mood swings than before. Depression comes and goes some.  Need to resume therapy.  Situational. Sleep good with meds. Anxiety is so so.  Wouldn't go out to eat at beach.  Still goes dancing bc feels secure there but won't go out to eat with lots of people.  Self esteem issue.  Knows she needs to work on this in therapy.   No SE.   No driving in 3 years DT anxiety.  Need to start back but scared. Always issues with driving.   A couple panic for no reason but usually if unfamiliar situations. Panic over trigger of helicopter [employer] recently near bc one picked up her child that died years ago. Other things can trigger panic like smells that remind her of D that died. Plan no med changes Continue clonazepam 0.5 mg daily as needed for anxiety, gabapentin 300 mg in the evening and at bedtime, lamotrigine 150 mg twice daily, lithium 750 mg nightly  03/25/2023 appointment noted: Continues meds above and takes clonazepam 0.5 mg HS to sleep Sleep is good A little more depression and anxiety.  Through the winter. In therapy with Dr. Rica Mote.  Rotten self esteem so often won't go out of the house. Trouble  adjusting summer bc wearing less clothing makes her feel humongous. Laying awake at night worrying about having to go somewhere tomorrow. Quit driving DT anxiety. Scared of expressing herself.  Doesn't think she can get words out correctly. Some irritability.  Menopausal.   Failed psychiatric medication trials include Trileptal, carbamazepine,  SSRIs,  sertraline, bupropion,  Vraylar, Rexulti, Geodon, Latuda, Saphris, pramipexole,  Vyvanse 40 milligrams effective but dry mouth.  Ritalin SR 40 NR.    She also refuses meds that have weight gain risk.   Has tried propranolol prn for anxiety and can tell a touch of difference at 40 mg/D.  Review of Systems:  Review of Systems  Cardiovascular:  Negative for palpitations.  Gastrointestinal:  Negative for abdominal pain.  Musculoskeletal:  Positive for arthralgias.  Neurological:  Negative for tremors and weakness.  Psychiatric/Behavioral:  Positive for decreased concentration and dysphoric mood. Negative for agitation, behavioral problems, confusion, hallucinations, self-injury, sleep disturbance and suicidal ideas. The patient is nervous/anxious. The patient is not hyperactive.     Medications: I have reviewed the patient's current medications.  Current Outpatient Medications  Medication Sig Dispense Refill   Cholecalciferol 25 MCG (1000 UT) tablet Take by mouth.     clonazePAM (KLONOPIN) 0.5 MG tablet TAKE 1 TABLET BY MOUTH EVERY DAY AS NEEDED 30 tablet 2   gabapentin (NEURONTIN) 300 MG capsule 1 IN EVENING AND 1 AT BEDTIME 180 capsule 1   lamoTRIgine (LAMICTAL) 150 MG tablet TAKE 1 TABLET BY MOUTH TWICE A DAY 60 tablet 0   levothyroxine (SYNTHROID) 50 MCG tablet TAKE 1 TABLET BY MOUTH EVERY DAY ON AN EMPTY STOMACH 30-60 MINUTES BEFORE BREAKFAST 90 tablet 3   lithium carbonate 150 MG capsule TAKE 1 CAPSULE BY MOUTH EVERY EVENING. 90 capsule 1   lithium carbonate 300 MG capsule TAKE 1 CAPSULE (300 MG TOTAL) BY MOUTH 2 (TWO) TIMES DAILY  WITH A MEAL. 180 capsule 1   omeprazole (PRILOSEC) 40 MG capsule TAKE 1 CAPSULE BY MOUTH EVERY DAY 90 capsule 0   No current facility-administered medications for this visit.    Medication Side Effects: dry mouth.  Allergies: No Known Allergies  Past Medical History:  Diagnosis Date   Bipolar 1 disorder ([place])    Cardiac arrhythmia    Depression    Endometriosis    Gestational diabetes    Overweight    RLS (restless legs syndrome)     Family History  Problem Relation Age of Onset   Heart disease Mother    Rheum arthritis Mother    Diabetes Father    Endometriosis Sister    Diabetes Sister    Breast cancer Maternal Aunt    Breast cancer Maternal Grandmother    Colon cancer Neg Hx    Ovarian cancer Neg Hx     Social History   Socioeconomic History   Marital status: Married    Spouse name: Not on file   Number of children: 2   Years of education: Not on file   Highest education level: Not on file  Occupational History    Comment: Unemployed. 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  Tobacco Use   Smoking status: Never   Smokeless tobacco: Never  Vaping Use   Vaping Use: Never used  Substance and Sexual Activity   Alcohol use: Yes    Alcohol/week: 0.0 standard drinks of alcohol    Comment: occas   Drug use: No   Sexual activity: Yes    Birth control/protection: Surgical  Other Topics Concern   Not on file  Social History Narrative   Not on file   Social Determinants of Health   Financial Resource Strain: Not on file  Food Insecurity: Not on file  Transportation Needs: Not on file  Physical Activity: Not on file  Stress: Not on file  Social Connections: Not on file  Intimate Partner Violence: Not on file    Past Medical History, Surgical history, Social history, and Family history were reviewed and updated as appropriate.   Caffeine 6 cups/D bc I'm sleepy.  Please see review of systems for further details on the patient's review from today.    Objective:   Physical Exam:  There were no vitals taken for this visit.  Physical Exam Constitutional:      General:  She is not in acute distress. Musculoskeletal:        General: No deformity.  Neurological:     Mental Status: She is alert and oriented to person, place, and time.     Cranial Nerves: No dysarthria.     Coordination: Coordination normal.  Psychiatric:        Attention and Perception: Attention and perception normal. She does not perceive auditory or visual hallucinations.        Mood and Affect: Mood is anxious and depressed. Affect is not labile, blunt, angry, tearful or inappropriate.        Speech: Speech normal.        Behavior: Behavior normal. Behavior is cooperative.        Thought Content: Thought content normal. Thought content is not paranoid or delusional. Thought content does not include homicidal or suicidal ideation. Thought content does not include homicidal or suicidal plan.        Cognition and Memory: Cognition and memory normal.        Judgment: Judgment normal.     Comments: Insight fair.  Residual depression and anxiety but stable More avoidant        Lab Review:     Component Value Date/Time   NA 141 10/24/2020 0858   K 4.5 10/24/2020 0858   CL 106 10/24/2020 0858   CO2 22 10/24/2020 0858   GLUCOSE 94 10/24/2020 0858   BUN 20 10/24/2020 0858   CREATININE 0.65 10/24/2020 0858   CALCIUM 11.0 (H) 10/24/2020 0858   PROT 6.7 10/24/2020 0858   ALBUMIN 4.8 10/24/2020 0858   AST 14 10/24/2020 0858   ALT 13 10/24/2020 0858   ALKPHOS 45 10/24/2020 0858   BILITOT <0.2 10/24/2020 0858   GFRNONAA 103 10/24/2020 0858   GFRAA 119 10/24/2020 0858       Component Value Date/Time   WBC 7.4 10/24/2020 0858   RBC 4.13 10/24/2020 0858   HGB 11.7 10/24/2020 0858   HCT 35.8 10/24/2020 0858   PLT 312 10/24/2020 0858   MCV 87 10/24/2020 0858   MCH 28.3 10/24/2020 0858   MCHC 32.7 10/24/2020 0858   RDW 13.7 10/24/2020 0858    Lithium  Lvl  Date Value Ref Range Status  08/01/2021 0.48 (L) 0.60 - 1.20 mmol/L Final    Comment:    Performed at [facility], [address] [place], [place] [address]     No results found for: "PHENYTOIN", "PHENOBARB", "VALPROATE", "CBMZ"   .res Assessment: Plan:    There are no diagnoses linked to this encounter. Probably PTSD too.   Greater than 50% of face to face time with patient was spent on counseling and coordination of care. 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  Failed multiple other meds.    Disc anxiety and how she's more avoidant.  Need to work on this.  We discussed the short-term risks associated with benzodiazepines including sedation and increased fall risk among others.  Discussed long-term side effect risk including dependence, potential withdrawal symptoms, and the potential eventual dose-related risk of dementia.  She is on a very low dosage and is not having significant memory complaints.  New studies refute risk.  But disc risk short term memory trouble.  Need to work on positive self talk bc she demeans herself. 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  Needs to get back to driving.  She plans ro continue therapy. 20 min discussion on body image distortions and strategies and  reality testing.  Disc catastrophizing..  Have disc weight loss meds Ozempic etc. Body image issues since 2nd grade.   Consider off label naltrexone, NAC  Overall her bipolar symptoms are not extreme.  She has chronic mild rapid cycling and spends more time depressed than up.  When it is been we have been unable to completely manage this with multiple meds tried as noted above.   Depression and anxiety are worse bc worsening body image problems.  More avoidant of going out.    She is under a little less stress situationally which has helped.   She has a history of some seasonal depression   Continue lithium 750 mg daily Continue lamotrigine 150 mg twice  daily Continue clonazepam 0.5 mg HS Continue gabapentin 300 mg at 5 PM and nightly Add B complex vitamin for memory, never tried  Labs from 07/2021 were good.  Need to follow calcium which was a little elevated 11.0. Lithium 0.48 04/15/22 normal kidney function, Calcium 10.4, normal TSH Repeat labs  Option clonidine off label for anxiety or manic irritability  Counseled patient regarding potential benefits, risks, and side effects of lithium to include potential risk of lithium affecting thyroid and renal function.  Discussed need for periodic lab monitoring to determine drug level and to assess for potential adverse effects.  Counseled patient regarding signs and symptoms of lithium toxicity and advised that they notify office immediately or seek urgent medical attention if experiencing these signs and symptoms.  Patient advised to contact office with any questions or concerns. Her typical lithium levels are in the very low normal range 0.5 because that is about all she can typically tolerate.  (NAC) N-Acetylcysteine 1 of the  600 mg capsules twice dailyfor 1 week then 2 twice daily to help with mild cognitive problems and body image issues.   Auvelity 1 in the AM for 1 week then 1 twice daily for depression. If no benefit then call back and I'll RX naltrexone for the body image issue and weight loss  FU 3 months  Lynder Parents, MD, DFAPA  Please see After Visit Summary for patient specific instructions.  No future appointments.    No orders of the defined types were placed in this encounter.      -------------------------------

## 2023-04-02 ENCOUNTER — Telehealth: Payer: Self-pay | Admitting: Psychiatry

## 2023-04-02 NOTE — Telephone Encounter (Signed)
Patient called in stating that at last appt CC gave her samples of Auvelity to try. She says its causing her to have dry mouth and was told to call if the [facility] wasn't working as she was told she could try something else. Ph: [phone] Appt 7/2

## 2023-04-03 ENCOUNTER — Other Ambulatory Visit: Payer: Self-pay | Admitting: Psychiatry

## 2023-04-03 MED ORDER — NALTREXONE HCL 50 MG PO TABS
ORAL_TABLET | ORAL | 0 refills | Status: DC
Start: 2023-04-03 — End: 2023-04-30

## 2023-04-03 NOTE — Telephone Encounter (Signed)
I agree with her request.  Have her stop the Auvelity.  Please bring any remaining samples she has back at her next appointment.  I sent in the prescription for the naltrexone and the instructions will be on the bottle.

## 2023-04-03 NOTE — Telephone Encounter (Signed)
Patient c/o dry mouth with Auvelity that is causing her anxiety. She said you would Rx naltrexone if the [facility] didn't work.     Pharmacy is [facility] on [address] in [place].

## 2023-04-06 NOTE — Telephone Encounter (Signed)
Patient notified of Rx and recommendations.  

## 2023-04-30 ENCOUNTER — Other Ambulatory Visit: Payer: Self-pay | Admitting: Psychiatry

## 2023-05-14 ENCOUNTER — Other Ambulatory Visit: Payer: Self-pay | Admitting: Psychiatry

## 2023-05-14 DIAGNOSIS — F5105 Insomnia due to other mental disorder: Secondary | ICD-10-CM

## 2023-06-16 ENCOUNTER — Encounter: Payer: Self-pay | Admitting: Psychiatry

## 2023-06-16 ENCOUNTER — Ambulatory Visit (INDEPENDENT_AMBULATORY_CARE_PROVIDER_SITE_OTHER): Payer: BC Managed Care – PPO | Admitting: Psychiatry

## 2023-06-16 DIAGNOSIS — F4522 Body dysmorphic disorder: Secondary | ICD-10-CM

## 2023-06-16 MED ORDER — TOPIRAMATE 25 MG PO TABS: ORAL_TABLET | ORAL | 0 refills | Status: DC

## 2023-06-16 NOTE — Progress Notes (Signed)
Briana Fox [id number] Apr 06, 1971 52 y.o.    Subjective:   Patient ID:  Briana Fox is a 52 y.o. (DOB June 28, 1971) female.  Chief Complaint:  Chief Complaint  Patient presents with   Follow-up   Depression   Anxiety    Depression        Associated symptoms include decreased concentration.  Associated symptoms include no suicidal ideas.  Past medical history includes anxiety.   Anxiety Symptoms include decreased concentration and nervous/anxious behavior. Patient reports no confusion, palpitations or suicidal ideas.      Briana Fox presents to the office today for follow-up of depression and anxiety and binge eating disorder.    When seen May 09, 2019.  She had marked improvement in the past with regard to mood and binge eating with Vyvanse but could not tolerate the dry mouth and discontinued it.  So at the last appointment we prescribed Concerta as the next best most similar long-acting stimulant option.  However she called back stating she cannot afford that 1.  Therefore Ritalin-SR 20 mg 1 every morning was sent in and it didn't work.    visit was July 25, 2019 and we increased Ritalin-SR to 20 mg twice daily for residual depression, attention problems, and binge eating off label.  Didn't work out bc cost too much and it didn't help much.  seen September 2020.  No meds were changed.  She had stopped the stimulant because it was not very helpful and was expensive.  March 2021 appointment the following is noted: Getting over Covid now.  Dx 2/23 sx started like the flu. No fever.  Sx remain fatigue and cough. For the most part mood been fair and satisfactory since here.  Fine with the meds. Situation calmer with less demands on her with only usually 1 child at a time.  Helped her mood.  Some depression with situations with several deaths from the Covid.  Still dances but does it outside.  1 of the people in the group died.  Mood still with ups and downs but not  severe.  Some depression..  She thinks menopause and struggling with age bc turning 52 yo soon the age when her Mother died.  Sister claims M overdosed.  Patient doesn't know. [name] and describes anxiety as less than before mild- Moderate. Anxiety symptoms include: Excessive Worry, Panic Symptoms,.   Pt reports no sleep issues. Pt reports that appetite is good. Pt reports that energy is poor and down slightly. Concentration is poor. Forgetful.  Suicidal thoughts:  denied by patient. No med changes  08/02/2020 appointment the following is noted: Still doesn't have smell back. Lots of stress keeping 4 gkids.  But it will drop to 2 when they start school.  At times will notice feeling small amount of rage without a reason.  It's very irritating but brief.  Sometimes out of the blue. Very scared of Covid but hasn't gotten vaccine. Takes clonazepam at night mainly and asks to increase it bc initial and terminal.  Stay stressed.  My weight consumes me always and will think of it at night for sleep. Some concerns about memory. Clonazepam 0.5 mg 1/4 tablet in the morning and 1/2 tablet at night.  Can't fully stop am doses. Sleep is Ok Put on weight bc binge eating chocolate DT depression.  Binge eating is out of control.  Less on meds and less on weekends. Plan: Continue lithium 750 mg daily Continue lamotrigine  150 mg twice daily Switch to alprazolam to improve sleep 0.25-0.5 mg HS and 0.25 mg prn anxiety Continue gabapentin 300 mg at 5 PM and nightly Add B complex vitamin for memory  01/30/2021 appointment with the following noted: Goes dancing weekly but otherwise doesn't want to leave the house. Not sure if she's scared of Covid but doesn't like leaving the house.  More down and anxious over Covid.  Knows a number of people to die.  It's drug on and on. Sleep good here lately.  Tired lately.   Keeping gkids. Taking clonazepam 0.5 mg HS and not alprazolam.  07/22/2021 appt  noted: Overall pretty good. Lost 15# but self esteem is not better.  Cut back eating and exercising.  Struggles with her sense of attractiveness.  Buys too many clothes and then doesn't like the way she looks. Drags her mood down. Been a problem for her since 2nd grade. Keeping gkids, 16 mos and gson 54 yo is difficult. D is home schooling. Less hot flashes with gabapentin Plan: Continue lithium 750 mg daily Continue lamotrigine 150 mg twice daily Continue clonazepam 0.5 mg HS Continue gabapentin 300 mg at 5 PM and nightly Add B complex vitamin for memory, never tried  01/22/22 appt noted: Some seasonal depression.  Not severe.   No SE problems. Some chronic anxiety bu off and on and can panic over something she said or did.  Off and on all day will say she hates herself. Can't sleep without clonazepam  07/31/22 appt noted: Pretty good with less mood swings than before. Depression comes and goes some.  Need to resume therapy.  Situational. Sleep good with meds. Anxiety is so so.  Wouldn't go out to eat at beach.  Still goes dancing bc feels secure there but won't go out to eat with lots of people.  Self esteem issue.  Knows she needs to work on this in therapy.   No SE.   No driving in 3 years DT anxiety.  Need to start back but scared. Always issues with driving.   A couple panic for no reason but usually if unfamiliar situations. Panic over trigger of helicopter [employer] recently near bc one picked up her child that died years ago. Other things can trigger panic like smells that remind her of D that died. Plan no med changes Continue clonazepam 0.5 mg daily as needed for anxiety, gabapentin 300 mg in the evening and at bedtime, lamotrigine 150 mg twice daily, lithium 750 mg nightly  03/25/2023 appointment noted: Continues meds above and takes clonazepam 0.5 mg HS to sleep Sleep is good A little more depression and anxiety.  Through the winter. In therapy with Dr. Farrel Demark.  Rotten  self esteem so often won't go out of the house. Trouble adjusting summer bc wearing less clothing makes her feel humongous. Laying awake at night worrying about having to go somewhere tomorrow. Quit driving DT anxiety. Scared of expressing herself.  Doesn't think she can get words out correctly. Some irritability.  Menopausal. Plan: (NAC) N-Acetylcysteine 1 of the  600 mg capsules twice dailyfor 1 week then 2 twice daily to help with mild cognitive problems and body image issues.   Auvelity 1 in the AM for 1 week then 1 twice daily for depression. If no benefit then call back and I'll RX naltrexone for the body image issue and weight loss  04/02/23 TC:  Patient c/o dry mouth with Auvelity that is causing her anxiety. She said you would Rx  naltrexone if the Auvelity didn't work.       MD resp:  I agree with her request.  Have her stop the Auvelity.  Please bring any remaining samples she has back at her next appointment.  I sent in the prescription for the naltrexone and the instructions will be on the bottle.     06/16/23 appt noted: Benefit naltrexone initially with binge eating but gradually lost the benefit.  Does help craving for coffee. No sign SE  now .  Initially had N.  Wants help with wt if possible and wanted to retry Wellb with naltrexone.  Mood swings and feeling out of control often.  Often tied to neg self talk.   Stress F witout water and helping him.   Wants to lose 10#.   Failed psychiatric medication trials include Trileptal, carbamazepine,  SSRIs,  sertraline, bupropion,  Vraylar, Rexulti, Geodon, Latuda, Saphris, pramipexole,  Vyvanse 40 milligrams effective but dry mouth.  Ritalin SR 40 NR.    She also refuses meds that have weight gain risk.   Has tried propranolol prn for anxiety and can tell a touch of difference at 40 mg/D.  Review of Systems:  Review of Systems  Cardiovascular:  Negative for palpitations.  Gastrointestinal:  Negative for abdominal pain.   Musculoskeletal:  Positive for arthralgias.  Neurological:  Negative for tremors.  Psychiatric/Behavioral:  Positive for decreased concentration, depression and dysphoric mood. Negative for agitation, behavioral problems, confusion, hallucinations, self-injury, sleep disturbance and suicidal ideas. The patient is nervous/anxious. The patient is not hyperactive.     Medications: I have reviewed the patient's current medications.  Current Outpatient Medications  Medication Sig Dispense Refill   Cholecalciferol 25 MCG (1000 UT) tablet Take by mouth.     clonazePAM (KLONOPIN) 0.5 MG tablet TAKE 1 TABLET BY MOUTH EVERY DAY AS NEEDED 30 tablet 1   gabapentin (NEURONTIN) 300 MG capsule 1 IN EVENING AND 1 AT BEDTIME 180 capsule 0   lamoTRIgine (LAMICTAL) 150 MG tablet Take 1 tablet (150 mg total) by mouth 2 (two) times daily. 180 tablet 0   levothyroxine (SYNTHROID) 50 MCG tablet TAKE 1 TABLET BY MOUTH EVERY DAY ON AN EMPTY STOMACH 30-60 MINUTES BEFORE BREAKFAST 90 tablet 3   lithium carbonate 150 MG capsule TAKE 1 CAPSULE BY MOUTH EVERY EVENING. 90 capsule 1   lithium carbonate 300 MG capsule TAKE 1 CAPSULE (300 MG TOTAL) BY MOUTH 2 (TWO) TIMES DAILY WITH A MEAL. 180 capsule 1   omeprazole (PRILOSEC) 40 MG capsule TAKE 1 CAPSULE BY MOUTH EVERY DAY 90 capsule 0   topiramate (TOPAMAX) 25 MG tablet 1 at night for 1 week then 1 BID for 1 week then 1 in the AM and 2 in the PM for 1 week then 2 tablets BID 120 tablet 0   naltrexone (DEPADE) 50 MG tablet Take 0.5 tablets (25 mg total) by mouth 2 (two) times daily. (Patient not taking: Reported on 06/16/2023) 90 tablet 0   No current facility-administered medications for this visit.    Medication Side Effects: dry mouth.  Allergies: No Known Allergies  Past Medical History:  Diagnosis Date   Bipolar 1 disorder (HCC)    Cardiac arrhythmia    Depression    Endometriosis    Gestational diabetes    Overweight    RLS (restless legs syndrome)      Family History  Problem Relation Age of Onset   Heart disease Mother    Rheum arthritis Mother  Diabetes Father    Endometriosis Sister    Diabetes Sister    Breast cancer Maternal Aunt    Breast cancer Maternal Grandmother    Colon cancer Neg Hx    Ovarian cancer Neg Hx     Social History   Socioeconomic History   Marital status: Married    Spouse name: Not on file   Number of children: 2   Years of education: Not on file   Highest education level: Not on file  Occupational History    Comment: Unemployed. 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  Tobacco Use   Smoking status: Never   Smokeless tobacco: Never  Vaping Use   Vaping Use: Never used  Substance and Sexual Activity   Alcohol use: Yes    Alcohol/week: 0.0 standard drinks of alcohol    Comment: occas   Drug use: No   Sexual activity: Yes    Birth control/protection: Surgical  Other Topics Concern   Not on file  Social History Narrative   Not on file   Social Determinants of Health   Financial Resource Strain: Not on file  Food Insecurity: Not on file  Transportation Needs: Not on file  Physical Activity: Not on file  Stress: Not on file  Social Connections: Not on file  Intimate Partner Violence: Not on file    Past Medical History, Surgical history, Social history, and Family history were reviewed and updated as appropriate.   Caffeine 6 cups/D bc I'm sleepy.  Please see review of systems for further details on the patient's review from today.   Objective:   Physical Exam:  There were no vitals taken for this visit.  Physical Exam Constitutional:      General: She is not in acute distress. Musculoskeletal:        General: No deformity.  Neurological:     Mental Status: She is alert and oriented to person, place, and time.     Cranial Nerves: No dysarthria.     Coordination: Coordination normal.  Psychiatric:        Attention and Perception: Attention and perception normal.  She does not perceive auditory or visual hallucinations.        Mood and Affect: Mood is anxious and depressed. Affect is not labile, blunt, angry or tearful.        Speech: Speech normal.        Behavior: Behavior normal. Behavior is cooperative.        Thought Content: Thought content normal. Thought content is not paranoid or delusional. Thought content does not include homicidal or suicidal ideation. Thought content does not include suicidal plan.        Cognition and Memory: Cognition and memory normal.        Judgment: Judgment normal.     Comments: Insight fair.  Residual depression and anxiety but stable More avoidant        Lab Review:     Component Value Date/Time   NA 141 10/24/2020 0858   K 4.5 10/24/2020 0858   CL 106 10/24/2020 0858   CO2 22 10/24/2020 0858   GLUCOSE 94 10/24/2020 0858   BUN 20 10/24/2020 0858   CREATININE 0.65 10/24/2020 0858   CALCIUM 11.0 (H) 10/24/2020 0858   PROT 6.7 10/24/2020 0858   ALBUMIN 4.8 10/24/2020 0858   AST 14 10/24/2020 0858   ALT 13 10/24/2020 0858   ALKPHOS 45 10/24/2020 0858   BILITOT <0.2 10/24/2020 0858   GFRNONAA 103  10/24/2020 0858   GFRAA 119 10/24/2020 0858       Component Value Date/Time   WBC 7.4 10/24/2020 0858   RBC 4.13 10/24/2020 0858   HGB 11.7 10/24/2020 0858   HCT 35.8 10/24/2020 0858   PLT 312 10/24/2020 0858   MCV 87 10/24/2020 0858   MCH 28.3 10/24/2020 0858   MCHC 32.7 10/24/2020 0858   RDW 13.7 10/24/2020 0858    Lithium Lvl  Date Value Ref Range Status  08/01/2021 0.48 (L) 0.60 - 1.20 mmol/L Final    Comment:    Performed at [facility], [address] [place], [place] [address]     No results found for: "PHENYTOIN", "PHENOBARB", "VALPROATE", "CBMZ"   .res Assessment: Plan:    Briana Fox was seen today for follow-up, depression and anxiety.  Diagnoses and all orders for this visit:  Body image disorder -     topiramate (TOPAMAX) 25 MG tablet; 1 at night for 1 week then 1  BID for 1 week then 1 in the AM and 2 in the PM for 1 week then 2 tablets BID  Probably PTSD too. 30 min of face to face time with patient was spent on counseling and coordination of care. 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  Failed multiple other meds.    Disc anxiety and how she's more avoidant.  Need to work on this.  We discussed the short-term risks associated with benzodiazepines including sedation and increased fall risk among others.  Discussed long-term side effect risk including dependence, potential withdrawal symptoms, and the potential eventual dose-related risk of dementia.  She is on a very low dosage and is not having significant memory complaints.  New studies refute risk.  But disc risk short term memory trouble.  Need to work on positive self talk bc she demeans herself. 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  Needs to get back to driving.  She plans ro continue therapy.  Have disc weight loss meds Ozempic etc. Body image issues since 2nd grade.   Consider off label naltrexone, NAC, topiramate  Trial topiramate for wt loss and off label might help mood stability. She had brief benefit from naltrexone for appetite and weight loss but habituated to it.  She wants to consider alternatives.  We discussed combination and her request of Wellbutrin plus naltrexone versus a trial of topiramate.  Given that it seems she is fairly sensitive to the bupropion trial of topiramate seems more reasonable. Start topiramate 25 mg daily and gradually increased to 50 mg twice daily.  If no response, back to the office and we can increase provided it is well-tolerated. Sometimes this is can add to some mood stability although it is not a primary mood stabilizer.  Discussed side effects in detail.  Overall her bipolar symptoms are not extreme.  She has chronic mild rapid cycling and spends more time depressed than up.  When it is been we have been unable to completely manage  this with multiple meds tried as noted above.   Depression and anxiety are worse bc worsening body image problems.  More avoidant of going out.    She is under a little less stress situationally which has helped.   She has a history of some seasonal depression   Continue lithium 750 mg daily Continue lamotrigine 150 mg twice daily Continue clonazepam 0.5 mg HS Continue gabapentin 300 mg at 5 PM and nightly Add B complex vitamin for memory, never tried  04/15/22 normal kidney function, Calcium 10.4, normal TSH Repeat labs  Option clonidine off label for anxiety or manic irritability.    Counseled patient regarding potential benefits, risks, and side effects of lithium to include potential risk of lithium affecting thyroid and renal function.  Discussed need for periodic lab monitoring to determine drug level and to assess for potential adverse effects.  Counseled patient regarding signs and symptoms of lithium toxicity and advised that they notify office immediately or seek urgent medical attention if experiencing these signs and symptoms.  Patient advised to contact office with any questions or concerns. Her typical lithium levels are in the very low normal range 0.5 because that is about all she can typically tolerate.  FU 3 months  Meredith Staggers, MD, DFAPA  Please see After Visit Summary for patient specific instructions.  Future Appointments  Date Time Provider Department Center  09/15/2023  4:30 PM Cottle, Steva Ready., MD [facility] None      No orders of the defined types were placed in this encounter.      -------------------------------

## 2023-06-23 ENCOUNTER — Ambulatory Visit: Payer: BC Managed Care – PPO | Admitting: Psychiatry

## 2023-07-06 ENCOUNTER — Other Ambulatory Visit: Payer: Self-pay | Admitting: Psychiatry

## 2023-07-06 DIAGNOSIS — F319 Bipolar disorder, unspecified: Secondary | ICD-10-CM

## 2023-07-06 DIAGNOSIS — F5105 Insomnia due to other mental disorder: Secondary | ICD-10-CM

## 2023-07-08 ENCOUNTER — Telehealth: Payer: Self-pay | Admitting: Psychiatry

## 2023-07-08 NOTE — Telephone Encounter (Signed)
Patient is going to the beach, asking for early RF. Per pharmacy patient picked up clonazepam 6/24, due 7/22. She would like to pick up 7/19.

## 2023-07-08 NOTE — Telephone Encounter (Signed)
Briana Fox called at 11:57 to report that the pharmacy will not fill her Clonazepam because it is too soon, but she is going to the beach and needs to pick it up early.  Please call the pharmacy to approve early refill.  [facility] on [address] in [place]

## 2023-07-09 ENCOUNTER — Other Ambulatory Visit: Payer: Self-pay

## 2023-07-09 DIAGNOSIS — F5105 Insomnia due to other mental disorder: Secondary | ICD-10-CM

## 2023-07-09 MED ORDER — CLONAZEPAM 0.5 MG PO TABS: 0.5000 mg | ORAL_TABLET | Freq: Every day | ORAL | 1 refills | Status: DC | PRN

## 2023-07-09 NOTE — Telephone Encounter (Signed)
Pended.

## 2023-08-29 ENCOUNTER — Other Ambulatory Visit: Payer: Self-pay | Admitting: Psychiatry

## 2023-08-29 DIAGNOSIS — F319 Bipolar disorder, unspecified: Secondary | ICD-10-CM

## 2023-09-05 ENCOUNTER — Other Ambulatory Visit: Payer: Self-pay | Admitting: Psychiatry

## 2023-09-05 DIAGNOSIS — F319 Bipolar disorder, unspecified: Secondary | ICD-10-CM

## 2023-09-15 ENCOUNTER — Ambulatory Visit: Payer: BC Managed Care – PPO | Admitting: Psychiatry

## 2023-09-15 ENCOUNTER — Encounter: Payer: Self-pay | Admitting: Psychiatry

## 2023-09-15 DIAGNOSIS — F4010 Social phobia, unspecified: Secondary | ICD-10-CM

## 2023-09-15 DIAGNOSIS — F319 Bipolar disorder, unspecified: Secondary | ICD-10-CM

## 2023-09-15 DIAGNOSIS — Z79899 Other long term (current) drug therapy: Secondary | ICD-10-CM

## 2023-09-15 DIAGNOSIS — F411 Generalized anxiety disorder: Secondary | ICD-10-CM | POA: Diagnosis not present

## 2023-09-15 DIAGNOSIS — F902 Attention-deficit hyperactivity disorder, combined type: Secondary | ICD-10-CM

## 2023-09-15 DIAGNOSIS — F5105 Insomnia due to other mental disorder: Secondary | ICD-10-CM | POA: Diagnosis not present

## 2023-09-15 DIAGNOSIS — F4522 Body dysmorphic disorder: Secondary | ICD-10-CM

## 2023-09-15 MED ORDER — PRAMIPEXOLE DIHYDROCHLORIDE 0.5 MG PO TABS: ORAL_TABLET | ORAL | 0 refills | Status: DC

## 2023-09-15 MED ORDER — LAMOTRIGINE 150 MG PO TABS: 150.0000 mg | ORAL_TABLET | Freq: Two times a day (BID) | ORAL | 0 refills | Status: DC

## 2023-09-15 MED ORDER — LITHIUM CARBONATE 150 MG PO CAPS: 150.0000 mg | ORAL_CAPSULE | Freq: Every day | ORAL | 1 refills | Status: DC

## 2023-09-15 MED ORDER — CLONAZEPAM 0.5 MG PO TABS: 0.5000 mg | ORAL_TABLET | Freq: Every day | ORAL | 2 refills | Status: DC

## 2023-09-15 NOTE — Progress Notes (Signed)
Briana Fox [id number] Feb 15, 1971 52 y.o.    Subjective:   Patient ID:  Briana Fox is a 52 y.o. (DOB 24-Apr-1971) female.  Chief Complaint:  Chief Complaint  Patient presents with   Follow-up   Depression   Anxiety   Sleeping Problem    Depression        Associated symptoms include decreased concentration.  Associated symptoms include no suicidal ideas.  Past medical history includes anxiety.   Anxiety Symptoms include decreased concentration and nervous/anxious behavior. Patient reports no confusion, palpitations or suicidal ideas.      Briana Fox presents to the office today for follow-up of depression and anxiety and binge eating disorder.    When seen May 09, 2019.  She had marked improvement in the past with regard to mood and binge eating with Vyvanse but could not tolerate the dry mouth and discontinued it.  So at the last appointment we prescribed Concerta as the next best most similar long-acting stimulant option.  However she called back stating she cannot afford that 1.  Therefore Ritalin-SR 20 mg 1 every morning was sent in and it didn't work.    visit was July 25, 2019 and we increased Ritalin-SR to 20 mg twice daily for residual depression, attention problems, and binge eating off label.  Didn't work out bc cost too much and it didn't help much.  seen September 2020.  No meds were changed.  She had stopped the stimulant because it was not very helpful and was expensive.  March 2021 appointment the following is noted: Getting over Covid now.  Dx 2/23 sx started like the flu. No fever.  Sx remain fatigue and cough. For the most part mood been fair and satisfactory since here.  Fine with the meds. Situation calmer with less demands on her with only usually 1 child at a time.  Helped her mood.  Some depression with situations with several deaths from the Covid.  Still dances but does it outside.  1 of the people in the group died.  Mood still with ups  and downs but not severe.  Some depression..  She thinks menopause and struggling with age bc turning 52 yo soon the age when her Mother died.  Sister claims M overdosed.  Patient doesn't know. [name] and describes anxiety as less than before mild- Moderate. Anxiety symptoms include: Excessive Worry, Panic Symptoms,.   Pt reports no sleep issues. Pt reports that appetite is good. Pt reports that energy is poor and down slightly. Concentration is poor. Forgetful.  Suicidal thoughts:  denied by patient. No med changes  08/02/2020 appointment the following is noted: Still doesn't have smell back. Lots of stress keeping 4 gkids.  But it will drop to 2 when they start school.  At times will notice feeling small amount of rage without a reason.  It's very irritating but brief.  Sometimes out of the blue. Very scared of Covid but hasn't gotten vaccine. Takes clonazepam at night mainly and asks to increase it bc initial and terminal.  Stay stressed.  My weight consumes me always and will think of it at night for sleep. Some concerns about memory. Clonazepam 0.5 mg 1/4 tablet in the morning and 1/2 tablet at night.  Can't fully stop am doses. Sleep is Ok Put on weight bc binge eating chocolate DT depression.  Binge eating is out of control.  Less on meds and less on weekends. Plan: Continue lithium 750  mg daily Continue lamotrigine 150 mg twice daily Switch to alprazolam to improve sleep 0.25-0.5 mg HS and 0.25 mg prn anxiety Continue gabapentin 300 mg at 5 PM and nightly Add B complex vitamin for memory  01/30/2021 appointment with the following noted: Goes dancing weekly but otherwise doesn't want to leave the house. Not sure if she's scared of Covid but doesn't like leaving the house.  More down and anxious over Covid.  Knows a number of people to die.  It's drug on and on. Sleep good here lately.  Tired lately.   Keeping gkids. Taking clonazepam 0.5 mg HS and not  alprazolam.  07/22/2021 appt noted: Overall pretty good. Lost 15# but self esteem is not better.  Cut back eating and exercising.  Struggles with her sense of attractiveness.  Buys too many clothes and then doesn't like the way she looks. Drags her mood down. Been a problem for her since 2nd grade. Keeping gkids, 16 mos and gson 54 yo is difficult. D is home schooling. Less hot flashes with gabapentin Plan: Continue lithium 750 mg daily Continue lamotrigine 150 mg twice daily Continue clonazepam 0.5 mg HS Continue gabapentin 300 mg at 5 PM and nightly Add B complex vitamin for memory, never tried  01/22/22 appt noted: Some seasonal depression.  Not severe.   No SE problems. Some chronic anxiety bu off and on and can panic over something she said or did.  Off and on all day will say she hates herself. Can't sleep without clonazepam  07/31/22 appt noted: Pretty good with less mood swings than before. Depression comes and goes some.  Need to resume therapy.  Situational. Sleep good with meds. Anxiety is so so.  Wouldn't go out to eat at beach.  Still goes dancing bc feels secure there but won't go out to eat with lots of people.  Self esteem issue.  Knows she needs to work on this in therapy.   No SE.   No driving in 3 years DT anxiety.  Need to start back but scared. Always issues with driving.   A couple panic for no reason but usually if unfamiliar situations. Panic over trigger of helicopter [employer] recently near bc one picked up her child that died years ago. Other things can trigger panic like smells that remind her of D that died. Plan no med changes Continue clonazepam 0.5 mg daily as needed for anxiety, gabapentin 300 mg in the evening and at bedtime, lamotrigine 150 mg twice daily, lithium 750 mg nightly  03/25/2023 appointment noted: Continues meds above and takes clonazepam 0.5 mg HS to sleep Sleep is good A little more depression and anxiety.  Through the winter. In therapy  with Dr. Farrel Demark.  Rotten self esteem so often won't go out of the house. Trouble adjusting summer bc wearing less clothing makes her feel humongous. Laying awake at night worrying about having to go somewhere tomorrow. Quit driving DT anxiety. Scared of expressing herself.  Doesn't think she can get words out correctly. Some irritability.  Menopausal. Plan: (NAC) N-Acetylcysteine 1 of the  600 mg capsules twice dailyfor 1 week then 2 twice daily to help with mild cognitive problems and body image issues.   Auvelity 1 in the AM for 1 week then 1 twice daily for depression. If no benefit then call back and I'll RX naltrexone for the body image issue and weight loss  04/02/23 TC:  Patient c/o dry mouth with Auvelity that is causing her anxiety. She  said you would Rx naltrexone if the Auvelity didn't work.       MD resp:  I agree with her request.  Have her stop the Auvelity.  Please bring any remaining samples she has back at her next appointment.  I sent in the prescription for the naltrexone and the instructions will be on the bottle.     06/16/23 appt noted: Benefit naltrexone initially with binge eating but gradually lost the benefit.  Does help craving for coffee. No sign SE  now .  Initially had N.  Wants help with wt if possible and wanted to retry Wellb with naltrexone.  Mood swings and feeling out of control often.  Often tied to neg self talk.   Stress F witout water and helping him.   Wants to lose 10#.  Plan: Start topiramate 25 mg daily and gradually increased to 50 mg twice daily.    09/15/23 appt noted: Meds: Continue clonazepam 0.5 mg daily as needed for anxiety, gabapentin 300 mg in the evening and at bedtime, lamotrigine 150 mg twice daily, lithium 750 mg nightly, off naltrexone Couldn't afford topiramate.   Noticed struggling more with dep.  Some related to weight problems.  Got to the point that I don't want to go dancing and "that means there's a problem." Can tell if she  is getting manic.  But dep now.   Sleep good with clonazepam but not without it.  Failed psychiatric medication trials include Trileptal, carbamazepine,  SSRIs,  sertraline,  bupropion, Auvelity SE anxiety Vraylar, Rexulti, Geodon, Latuda, Saphris,  pramipexole,  topiramate Vyvanse 40 milligrams effective but dry mouth.  Ritalin SR 40 NR.   Clonazepam  She also refuses meds that have weight gain risk.   Has tried propranolol prn for anxiety and can tell a touch of difference at 40 mg/D.  Review of Systems:  Review of Systems  Cardiovascular:  Negative for palpitations.  Gastrointestinal:  Negative for abdominal pain.  Musculoskeletal:  Positive for arthralgias.  Neurological:  Negative for tremors.  Psychiatric/Behavioral:  Positive for decreased concentration and dysphoric mood. Negative for agitation, behavioral problems, confusion, hallucinations, self-injury, sleep disturbance and suicidal ideas. The patient is nervous/anxious. The patient is not hyperactive.     Medications: I have reviewed the patient's current medications.  Current Outpatient Medications  Medication Sig Dispense Refill   Cholecalciferol 25 MCG (1000 UT) tablet Take by mouth.     clonazePAM (KLONOPIN) 0.5 MG tablet Take 1 tablet (0.5 mg total) by mouth daily as needed. 30 tablet 1   gabapentin (NEURONTIN) 300 MG capsule 1 IN EVENING AND 1 AT BEDTIME 180 capsule 0   lamoTRIgine (LAMICTAL) 150 MG tablet TAKE 1 TABLET BY MOUTH TWICE A DAY 180 tablet 0   levothyroxine (SYNTHROID) 50 MCG tablet TAKE 1 TABLET BY MOUTH EVERY DAY ON AN EMPTY STOMACH 30-60 MINUTES BEFORE BREAKFAST 90 tablet 3   lithium carbonate 150 MG capsule TAKE 1 CAPSULE BY MOUTH EVERY DAY IN THE EVENING 30 capsule 0   lithium carbonate 300 MG capsule TAKE 1 CAPSULE (300 MG TOTAL) BY MOUTH 2 (TWO) TIMES DAILY WITH A MEAL. 60 capsule 0   naltrexone (DEPADE) 50 MG tablet Take 0.5 tablets (25 mg total) by mouth 2 (two) times daily. (Patient not  taking: Reported on 06/16/2023) 90 tablet 0   omeprazole (PRILOSEC) 40 MG capsule TAKE 1 CAPSULE BY MOUTH EVERY DAY 90 capsule 0   topiramate (TOPAMAX) 25 MG tablet 1 at night for 1 week then  1 BID for 1 week then 1 in the AM and 2 in the PM for 1 week then 2 tablets BID 120 tablet 0   No current facility-administered medications for this visit.    Medication Side Effects: dry mouth.  Allergies: No Known Allergies  Past Medical History:  Diagnosis Date   Bipolar 1 disorder (HCC)    Cardiac arrhythmia    Depression    Endometriosis    Gestational diabetes    Overweight    RLS (restless legs syndrome)     Family History  Problem Relation Age of Onset   Heart disease Mother    Rheum arthritis Mother    Diabetes Father    Endometriosis Sister    Diabetes Sister    Breast cancer Maternal Aunt    Breast cancer Maternal Grandmother    Colon cancer Neg Hx    Ovarian cancer Neg Hx     Social History   Socioeconomic History   Marital status: Married    Spouse name: Not on file   Number of children: 2   Years of education: Not on file   Highest education level: Not on file  Occupational History    Comment: Unemployed. 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  Tobacco Use   Smoking status: Never   Smokeless tobacco: Never  Vaping Use   Vaping status: Never Used  Substance and Sexual Activity   Alcohol use: Yes    Alcohol/week: 0.0 standard drinks of alcohol    Comment: occas   Drug use: No   Sexual activity: Yes    Birth control/protection: Surgical  Other Topics Concern   Not on file  Social History Narrative   Not on file   Social Determinants of Health   Financial Resource Strain: Not on file  Food Insecurity: Not on file  Transportation Needs: Not on file  Physical Activity: Not on file  Stress: Not on file  Social Connections: Not on file  Intimate Partner Violence: Not on file    Past Medical History, Surgical history, Social history, and Family  history were reviewed and updated as appropriate.   Caffeine 6 cups/D bc I'm sleepy.  Please see review of systems for further details on the patient's review from today.   Objective:   Physical Exam:  There were no vitals taken for this visit.  Physical Exam Constitutional:      General: She is not in acute distress. Musculoskeletal:        General: No deformity.  Neurological:     Mental Status: She is alert and oriented to person, place, and time.     Cranial Nerves: No dysarthria.     Coordination: Coordination normal.  Psychiatric:        Attention and Perception: Attention and perception normal. She does not perceive auditory or visual hallucinations.        Mood and Affect: Mood is anxious and depressed. Affect is not labile, blunt, angry or tearful.        Speech: Speech normal.        Behavior: Behavior normal. Behavior is cooperative.        Thought Content: Thought content normal. Thought content is not paranoid or delusional. Thought content does not include homicidal or suicidal ideation. Thought content does not include suicidal plan.        Cognition and Memory: Cognition and memory normal.        Judgment: Judgment normal.     Comments: Insight  fair.  Residual depression worse More avoidant        Lab Review:     Component Value Date/Time   NA 141 10/24/2020 0858   K 4.5 10/24/2020 0858   CL 106 10/24/2020 0858   CO2 22 10/24/2020 0858   GLUCOSE 94 10/24/2020 0858   BUN 20 10/24/2020 0858   CREATININE 0.65 10/24/2020 0858   CALCIUM 11.0 (H) 10/24/2020 0858   PROT 6.7 10/24/2020 0858   ALBUMIN 4.8 10/24/2020 0858   AST 14 10/24/2020 0858   ALT 13 10/24/2020 0858   ALKPHOS 45 10/24/2020 0858   BILITOT <0.2 10/24/2020 0858   GFRNONAA 103 10/24/2020 0858   GFRAA 119 10/24/2020 0858       Component Value Date/Time   WBC 7.4 10/24/2020 0858   RBC 4.13 10/24/2020 0858   HGB 11.7 10/24/2020 0858   HCT 35.8 10/24/2020 0858   PLT 312 10/24/2020  0858   MCV 87 10/24/2020 0858   MCH 28.3 10/24/2020 0858   MCHC 32.7 10/24/2020 0858   RDW 13.7 10/24/2020 0858    Lithium Lvl  Date Value Ref Range Status  08/01/2021 0.48 (L) 0.60 - 1.20 mmol/L Final    Comment:    Performed at [facility], [address] [place], [place] [address]     No results found for: "PHENYTOIN", "PHENOBARB", "VALPROATE", "CBMZ"   .res Assessment: Plan:    Azucena "Bradly Chris" was seen today for follow-up, depression, anxiety and sleeping problem.  Diagnoses and all orders for this visit:  Bipolar affective disorder, rapid cycling (HCC)  Social anxiety disorder  Generalized anxiety disorder  Insomnia due to mental condition  Attention deficit hyperactivity disorder (ADHD), combined type  Body image disorder  Lithium use  Hypercalcemia   Probably PTSD too. 30 min of face to face time with patient was spent on counseling and coordination of care. 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  Failed multiple other meds.    Disc anxiety and how she's more avoidant.  Need to work on this.  We discussed the short-term risks associated with benzodiazepines including sedation and increased fall risk among others.  Discussed long-term side effect risk including dependence, potential withdrawal symptoms, and the potential eventual dose-related risk of dementia.  She is on a very low dosage and is not having significant memory complaints.  New studies refute risk.  But disc risk short term memory trouble.  Need to work on positive self talk bc she demeans herself. 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  Needs to get back to driving.  She plans ro continue therapy.  Have disc weight loss meds Ozempic etc. Body image issues since 2nd grade.   Consider off label naltrexone, NAC, topiramate  Trial topiramate for wt loss and off label might help mood stability. She had brief benefit from naltrexone for appetite and weight loss but  habituated to it.  She wants to consider alternatives.  We discussed combination and her request of Wellbutrin plus naltrexone versus a trial of topiramate.  Given that it seems she is fairly sensitive to the bupropion trial of topiramate seems more reasonable. Defer topiramate 25 mg daily and gradually increased to 50 mg twice daily.  If no response, back to the office and we can increase provided it is well-tolerated. Sometimes this is can add to some mood stability although it is not a primary mood stabilizer.  Discussed side effects in detail.  Overall her bipolar symptoms are not extreme.  She has chronic mild rapid cycling and spends more time depressed than up. we have been unable to completely manage this with multiple meds tried as noted above. However dep interfering more.  Disc SE pramipexole incl mania but retry off label for dep.  She refused meds with wt gain potential.  Start pramipexole 0.5 mg tablet:  1/2 tablet twice daily for 1 week, then 1 tablet twice daily for 1 week, then 1-1/2 tablets twice daily   Depression and anxiety are worse bc worsening body image problems.  More avoidant of going out.    She is under a little less stress situationally which has helped.   She has a history of some seasonal depression   Continue lithium 750 mg daily Continue lamotrigine 150 mg twice daily Continue clonazepam 0.5 mg HS Continue gabapentin 300 mg at 5 PM and nightly Add B complex vitamin for memory, never tried  04/15/22 normal kidney function, Calcium 10.4, normal TSH Repeat labs  Option clonidine off label for anxiety or manic irritability.    Counseled patient regarding potential benefits, risks, and side effects of lithium to include potential risk of lithium affecting thyroid and renal function.  Discussed need for periodic lab monitoring to determine drug level and to assess for potential adverse effects.  Counseled patient regarding signs and symptoms of lithium toxicity and  advised that they notify office immediately or seek urgent medical attention if experiencing these signs and symptoms.  Patient advised to contact office with any questions or concerns. Her typical lithium levels are in the very low normal range 0.5 because that is about all she can typically tolerate.  FU 6 weeks.  Meredith Staggers, MD, DFAPA  Please see After Visit Summary for patient specific instructions.  Future Appointments  Date Time Provider Department Center  09/15/2023  4:30 PM Cottle, Steva Ready., MD [facility] None      No orders of the defined types were placed in this encounter.      -------------------------------

## 2023-09-24 NOTE — Telephone Encounter (Signed)
From MyChart  I haven't been able to take a half of the pill pramipexole. It makes me real sleepy. I am trying to take a quarter of the pill twice day. It still makes me sleepy and not as bad with the half. Do you think this is ok to try it this way.

## 2023-09-30 ENCOUNTER — Other Ambulatory Visit: Payer: Self-pay | Admitting: Psychiatry

## 2023-09-30 DIAGNOSIS — F319 Bipolar disorder, unspecified: Secondary | ICD-10-CM

## 2023-09-30 NOTE — Telephone Encounter (Signed)
Added dx f31.9; rx is for 3ms. LF 09/06/23

## 2023-10-08 ENCOUNTER — Other Ambulatory Visit: Payer: Self-pay | Admitting: Psychiatry

## 2023-10-08 DIAGNOSIS — F319 Bipolar disorder, unspecified: Secondary | ICD-10-CM

## 2023-10-08 NOTE — Telephone Encounter (Signed)
Called pt to verify dose. She reported that she disc. Taking the Mirapex last week dt "it knocks me out, its too strong", she would like to discuss possibly lowering the dose.  NV 11/7

## 2023-10-09 NOTE — Telephone Encounter (Signed)
Thanks for the info. She is asking to lower the dose.  Ask what dose she is taking currently?  What is the dose that "knocks" her out?  Ask if it has helped her mood.

## 2023-10-09 NOTE — Telephone Encounter (Signed)
Thanks for the info.  I will adjust the rX to give less during the day and more at night to reduce the tiredness and sleepiness.   Pls let her know to take pramipexole 0.5 mg tablets, 1/2 in the am and 1 and 1/2 tablets at night.

## 2023-10-09 NOTE — Telephone Encounter (Signed)
She reports she was taking the 0.5 mg as directed 1.5 tablets daily. She says that she did it help with her mood a little, but unsure if it was enough to try again if the dose can not be decreased. She reports that the 0.5 mg 1.5 tablets was the dose that was " knocking me out".

## 2023-10-29 ENCOUNTER — Ambulatory Visit: Payer: BC Managed Care – PPO | Admitting: Psychiatry

## 2023-10-29 ENCOUNTER — Encounter: Payer: Self-pay | Admitting: Psychiatry

## 2023-10-29 DIAGNOSIS — F902 Attention-deficit hyperactivity disorder, combined type: Secondary | ICD-10-CM

## 2023-10-29 DIAGNOSIS — Z79899 Other long term (current) drug therapy: Secondary | ICD-10-CM

## 2023-10-29 DIAGNOSIS — F4010 Social phobia, unspecified: Secondary | ICD-10-CM | POA: Diagnosis not present

## 2023-10-29 DIAGNOSIS — F319 Bipolar disorder, unspecified: Secondary | ICD-10-CM | POA: Diagnosis not present

## 2023-10-29 DIAGNOSIS — F5105 Insomnia due to other mental disorder: Secondary | ICD-10-CM

## 2023-10-29 DIAGNOSIS — F4522 Body dysmorphic disorder: Secondary | ICD-10-CM

## 2023-10-29 DIAGNOSIS — F411 Generalized anxiety disorder: Secondary | ICD-10-CM | POA: Diagnosis not present

## 2023-10-29 MED ORDER — CLONAZEPAM 0.5 MG PO TABS: 0.5000 mg | ORAL_TABLET | Freq: Every day | ORAL | 2 refills | Status: DC

## 2023-10-29 NOTE — Progress Notes (Signed)
Briana Fox [id number] 10-22-1971 52 y.o.    Subjective:   Patient ID:  Briana Fox is a 52 y.o. (DOB Dec 18, 1971) female.  Chief Complaint:  Chief Complaint  Patient presents with   Follow-up   Depression   Medication Problem   Anxiety    Depression        Associated symptoms include decreased concentration.  Associated symptoms include no suicidal ideas.  Past medical history includes anxiety.   Anxiety Symptoms include decreased concentration and nervous/anxious behavior. Patient reports no confusion, palpitations or suicidal ideas.      Briana Fox presents to the office today for follow-up of depression and anxiety and binge eating disorder.    When seen May 09, 2019.  She had marked improvement in the past with regard to mood and binge eating with Vyvanse but could not tolerate the dry mouth and discontinued it.  So at the last appointment we prescribed Concerta as the next best most similar long-acting stimulant option.  However she called back stating she cannot afford that 1.  Therefore Ritalin-SR 20 mg 1 every morning was sent in and it didn't work.    visit was July 25, 2019 and we increased Ritalin-SR to 20 mg twice daily for residual depression, attention problems, and binge eating off label.  Didn't work out bc cost too much and it didn't help much.  seen September 2020.  No meds were changed.  She had stopped the stimulant because it was not very helpful and was expensive.  March 2021 appointment the following is noted: Getting over Covid now.  Dx 2/23 sx started like the flu. No fever.  Sx remain fatigue and cough. For the most part mood been fair and satisfactory since here.  Fine with the meds. Situation calmer with less demands on her with only usually 1 child at a time.  Helped her mood.  Some depression with situations with several deaths from the Covid.  Still dances but does it outside.  1 of the people in the group died.  Mood still with  ups and downs but not severe.  Some depression..  She thinks menopause and struggling with age bc turning 52 yo soon the age when her Mother died.  Sister claims M overdosed.  Patient doesn't know. [name] and describes anxiety as less than before mild- Moderate. Anxiety symptoms include: Excessive Worry, Panic Symptoms,.   Pt reports no sleep issues. Pt reports that appetite is good. Pt reports that energy is poor and down slightly. Concentration is poor. Forgetful.  Suicidal thoughts:  denied by patient. No med changes  08/02/2020 appointment the following is noted: Still doesn't have smell back. Lots of stress keeping 4 gkids.  But it will drop to 2 when they start school.  At times will notice feeling small amount of rage without a reason.  It's very irritating but brief.  Sometimes out of the blue. Very scared of Covid but hasn't gotten vaccine. Takes clonazepam at night mainly and asks to increase it bc initial and terminal.  Stay stressed.  My weight consumes me always and will think of it at night for sleep. Some concerns about memory. Clonazepam 0.5 mg 1/4 tablet in the morning and 1/2 tablet at night.  Can't fully stop am doses. Sleep is Ok Put on weight bc binge eating chocolate DT depression.  Binge eating is out of control.  Less on meds and less on weekends. Plan: Continue lithium 750  mg daily Continue lamotrigine 150 mg twice daily Switch to alprazolam to improve sleep 0.25-0.5 mg HS and 0.25 mg prn anxiety Continue gabapentin 300 mg at 5 PM and nightly Add B complex vitamin for memory  01/30/2021 appointment with the following noted: Goes dancing weekly but otherwise doesn't want to leave the house. Not sure if she's scared of Covid but doesn't like leaving the house.  More down and anxious over Covid.  Knows a number of people to die.  It's drug on and on. Sleep good here lately.  Tired lately.   Keeping gkids. Taking clonazepam 0.5 mg HS and not  alprazolam.  07/22/2021 appt noted: Overall pretty good. Lost 15# but self esteem is not better.  Cut back eating and exercising.  Struggles with her sense of attractiveness.  Buys too many clothes and then doesn't like the way she looks. Drags her mood down. Been a problem for her since 2nd grade. Keeping gkids, 16 mos and gson 54 yo is difficult. D is home schooling. Less hot flashes with gabapentin Plan: Continue lithium 750 mg daily Continue lamotrigine 150 mg twice daily Continue clonazepam 0.5 mg HS Continue gabapentin 300 mg at 5 PM and nightly Add B complex vitamin for memory, never tried  01/22/22 appt noted: Some seasonal depression.  Not severe.   No SE problems. Some chronic anxiety bu off and on and can panic over something she said or did.  Off and on all day will say she hates herself. Can't sleep without clonazepam  07/31/22 appt noted: Pretty good with less mood swings than before. Depression comes and goes some.  Need to resume therapy.  Situational. Sleep good with meds. Anxiety is so so.  Wouldn't go out to eat at beach.  Still goes dancing bc feels secure there but won't go out to eat with lots of people.  Self esteem issue.  Knows she needs to work on this in therapy.   No SE.   No driving in 3 years DT anxiety.  Need to start back but scared. Always issues with driving.   A couple panic for no reason but usually if unfamiliar situations. Panic over trigger of helicopter [employer] recently near bc one picked up her child that died years ago. Other things can trigger panic like smells that remind her of D that died. Plan no med changes Continue clonazepam 0.5 mg daily as needed for anxiety, gabapentin 300 mg in the evening and at bedtime, lamotrigine 150 mg twice daily, lithium 750 mg nightly  03/25/2023 appointment noted: Continues meds above and takes clonazepam 0.5 mg HS to sleep Sleep is good A little more depression and anxiety.  Through the winter. In therapy  with Dr. Farrel Demark.  Rotten self esteem so often won't go out of the house. Trouble adjusting summer bc wearing less clothing makes her feel humongous. Laying awake at night worrying about having to go somewhere tomorrow. Quit driving DT anxiety. Scared of expressing herself.  Doesn't think she can get words out correctly. Some irritability.  Menopausal. Plan: (NAC) N-Acetylcysteine 1 of the  600 mg capsules twice dailyfor 1 week then 2 twice daily to help with mild cognitive problems and body image issues.   Auvelity 1 in the AM for 1 week then 1 twice daily for depression. If no benefit then call back and I'll RX naltrexone for the body image issue and weight loss  04/02/23 TC:  Patient c/o dry mouth with Auvelity that is causing her anxiety. She  said you would Rx naltrexone if the Auvelity didn't work.       MD resp:  I agree with her request.  Have her stop the Auvelity.  Please bring any remaining samples she has back at her next appointment.  I sent in the prescription for the naltrexone and the instructions will be on the bottle.     06/16/23 appt noted: Benefit naltrexone initially with binge eating but gradually lost the benefit.  Does help craving for coffee. No sign SE  now .  Initially had N.  Wants help with wt if possible and wanted to retry Wellb with naltrexone.  Mood swings and feeling out of control often.  Often tied to neg self talk.   Stress F witout water and helping him.   Wants to lose 10#.  Plan: Start topiramate 25 mg daily and gradually increased to 50 mg twice daily.    09/15/23 appt noted: Meds: Continue clonazepam 0.5 mg daily as needed for anxiety, gabapentin 300 mg in the evening and at bedtime, lamotrigine 150 mg twice daily, lithium 750 mg nightly, off naltrexone Couldn't afford topiramate.   Noticed struggling more with dep.  Some related to weight problems.  Got to the point that I don't want to go dancing and "that means there's a problem." Can tell if she  is getting manic.  But dep now.   Sleep good with clonazepam but not without it. Start pramipexole 0.5 mg tablet:  1/2 tablet twice daily for 1 week, then 1 tablet twice daily for 1 week, then 1-1/2 tablets twice daily  09/24/23  From MyChart  I haven't been able to take a half of the pill pramipexole. It makes me real sleepy. I am trying to take a quarter of the pill twice day. It still makes me sleepy and not as bad with the half. Do you think this is ok to try it this way.     MD: one quarter of a tablet all at night.  Then once that is tolerated try 1/4 tablet twice daily for a few days and if tolerated increase to 1/4 tablet in the morning and 1/2 tablet at night.  I can get her a lower dosage if needed.  It sounds like she is more sensitive than average.   10/29/23 appt noted: Complaining of dep and anxiety.  Latter getting worse. Didn't tolerate even tiny dose of pramipexole. No other changes since she was here.    Failed psychiatric medication trials include lithium  Lamotrigine 300, Trileptal, carbamazepine,  SSRIs,  sertraline,  bupropion, Auvelity SE anxiety Vraylar, Rexulti, Geodon, Latuda, Saphris,  Pramipexole couldn't tolerate DT sleepiness topiramate Vyvanse 40 milligrams effective but dry mouth.  Ritalin SR 40 NR.   Clonazepam  She also refuses meds that have weight gain risk.   Has tried propranolol prn for anxiety and can tell a touch of difference at 40 mg/D.  Review of Systems:  Review of Systems  Cardiovascular:  Negative for palpitations.  Gastrointestinal:  Negative for abdominal pain.  Musculoskeletal:  Positive for arthralgias.  Neurological:  Negative for tremors.  Psychiatric/Behavioral:  Positive for decreased concentration and dysphoric mood. Negative for agitation, confusion, hallucinations, self-injury, sleep disturbance and suicidal ideas. The patient is nervous/anxious. The patient is not hyperactive.     Medications: I have reviewed the patient's  current medications.  Current Outpatient Medications  Medication Sig Dispense Refill   Cholecalciferol 25 MCG (1000 UT) tablet Take by mouth.     gabapentin (  NEURONTIN) 300 MG capsule 1 IN EVENING AND 1 AT BEDTIME 180 capsule 0   lamoTRIgine (LAMICTAL) 150 MG tablet Take 1 tablet (150 mg total) by mouth 2 (two) times daily. 180 tablet 0   levothyroxine (SYNTHROID) 50 MCG tablet TAKE 1 TABLET BY MOUTH EVERY DAY ON AN EMPTY STOMACH 30-60 MINUTES BEFORE BREAKFAST 90 tablet 3   lithium carbonate 150 MG capsule Take 1 capsule (150 mg total) by mouth daily at 12 noon. 90 capsule 1   lithium carbonate 300 MG capsule TAKE 1 CAPSULE (300 MG TOTAL) BY MOUTH 2 (TWO) TIMES DAILY WITH A MEAL. 180 capsule 1   omeprazole (PRILOSEC) 40 MG capsule TAKE 1 CAPSULE BY MOUTH EVERY DAY 90 capsule 0   topiramate (TOPAMAX) 25 MG tablet 1 at night for 1 week then 1 BID for 1 week then 1 in the AM and 2 in the PM for 1 week then 2 tablets BID 120 tablet 0   clonazePAM (KLONOPIN) 0.5 MG tablet Take 1 tablet (0.5 mg total) by mouth at bedtime. 30 tablet 2   No current facility-administered medications for this visit.    Medication Side Effects: dry mouth.  Allergies: No Known Allergies  Past Medical History:  Diagnosis Date   Bipolar 1 disorder (HCC)    Cardiac arrhythmia    Depression    Endometriosis    Gestational diabetes    Overweight    RLS (restless legs syndrome)     Family History  Problem Relation Age of Onset   Heart disease Mother    Rheum arthritis Mother    Diabetes Father    Endometriosis Sister    Diabetes Sister    Breast cancer Maternal Aunt    Breast cancer Maternal Grandmother    Colon cancer Neg Hx    Ovarian cancer Neg Hx     Social History   Socioeconomic History   Marital status: Married    Spouse name: Not on file   Number of children: 2   Years of education: Not on file   Highest education level: Not on file  Occupational History    Comment: Unemployed. 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  Tobacco Use   Smoking status: Never   Smokeless tobacco: Never  Vaping Use   Vaping status: Never Used  Substance and Sexual Activity   Alcohol use: Yes    Alcohol/week: 0.0 standard drinks of alcohol    Comment: occas   Drug use: No   Sexual activity: Yes    Birth control/protection: Surgical  Other Topics Concern   Not on file  Social History Narrative   Not on file   Social Determinants of Health   Financial Resource Strain: Not on file  Food Insecurity: Not on file  Transportation Needs: Not on file  Physical Activity: Not on file  Stress: Not on file  Social Connections: Not on file  Intimate Partner Violence: Not on file    Past Medical History, Surgical history, Social history, and Family history were reviewed and updated as appropriate.   Caffeine 6 cups/D bc I'm sleepy.  Please see review of systems for further details on the patient's review from today.   Objective:   Physical Exam:  There were no vitals taken for this visit.  Physical Exam Constitutional:      General: She is not in acute distress. Musculoskeletal:        General: No deformity.  Neurological:     Mental Status: She  is alert and oriented to person, place, and time.     Cranial Nerves: No dysarthria.     Coordination: Coordination normal.  Psychiatric:        Attention and Perception: Attention and perception normal. She does not perceive auditory or visual hallucinations.        Mood and Affect: Mood is anxious and depressed. Affect is not labile, blunt, angry or tearful.        Speech: Speech normal.        Behavior: Behavior normal. Behavior is cooperative.        Thought Content: Thought content normal. Thought content is not paranoid or delusional. Thought content does not include homicidal or suicidal ideation. Thought content does not include suicidal plan.        Cognition and Memory: Cognition and memory normal.        Judgment: Judgment normal.      Comments: Insight fair.  Residual depression worse More avoidant        Lab Review:     Component Value Date/Time   NA 141 10/24/2020 0858   K 4.5 10/24/2020 0858   CL 106 10/24/2020 0858   CO2 22 10/24/2020 0858   GLUCOSE 94 10/24/2020 0858   BUN 20 10/24/2020 0858   CREATININE 0.65 10/24/2020 0858   CALCIUM 11.0 (H) 10/24/2020 0858   PROT 6.7 10/24/2020 0858   ALBUMIN 4.8 10/24/2020 0858   AST 14 10/24/2020 0858   ALT 13 10/24/2020 0858   ALKPHOS 45 10/24/2020 0858   BILITOT <0.2 10/24/2020 0858   GFRNONAA 103 10/24/2020 0858   GFRAA 119 10/24/2020 0858       Component Value Date/Time   WBC 7.4 10/24/2020 0858   RBC 4.13 10/24/2020 0858   HGB 11.7 10/24/2020 0858   HCT 35.8 10/24/2020 0858   PLT 312 10/24/2020 0858   MCV 87 10/24/2020 0858   MCH 28.3 10/24/2020 0858   MCHC 32.7 10/24/2020 0858   RDW 13.7 10/24/2020 0858    Lithium Lvl  Date Value Ref Range Status  08/01/2021 0.48 (L) 0.60 - 1.20 mmol/L Final    Comment:    Performed at [facility], [address] [place], [place] [address]     No results found for: "PHENYTOIN", "PHENOBARB", "VALPROATE", "CBMZ"   .res Assessment: Plan:    Birgit "Bradly Chris" was seen today for follow-up, depression, medication problem and anxiety.  Diagnoses and all orders for this visit:  Bipolar affective disorder, rapid cycling (HCC)  Social anxiety disorder  Generalized anxiety disorder  Attention deficit hyperactivity disorder (ADHD), combined type  Body image disorder  Insomnia due to mental condition -     clonazePAM (KLONOPIN) 0.5 MG tablet; Take 1 tablet (0.5 mg total) by mouth at bedtime.  Lithium use    Probably PTSD too. 30 min of face to face time with patient was spent on counseling and coordination of care. 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  Failed multiple other meds.    Disc anxiety and how she's more avoidant.  Need to work on this.  We discussed the short-term risks  associated with benzodiazepines including sedation and increased fall risk among others.  Discussed long-term side effect risk including dependence, potential withdrawal symptoms, and the potential eventual dose-related risk of dementia.  She is on a very low dosage and is not having significant memory complaints.  New studies refute risk.  But disc risk short term memory trouble.  Need to work on positive  self talk bc she demeans herself. 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  Needs to get back to driving.  She plans ro continue therapy.  Have disc weight loss meds Ozempic etc. Body image issues since 2nd grade.   Consider off label naltrexone, NAC, topiramate  Trial topiramate for wt loss and off label might help mood stability. She had brief benefit from naltrexone for appetite and weight loss but habituated to it.  She wants to consider alternatives.  We discussed combination and her request of Wellbutrin plus naltrexone versus a trial of topiramate.  Given that it seems she is fairly sensitive to the bupropion trial of topiramate seems more reasonable. Defer topiramate 25 mg daily and gradually increased to 50 mg twice daily.  If no response, back to the office and we can increase provided it is well-tolerated. Sometimes this is can add to some mood stability although it is not a primary mood stabilizer.  Discussed side effects in detail.  Overall her bipolar symptoms are not extreme.  She has chronic mild rapid cycling and spends more time depressed than up. we have been unable to completely manage this with multiple meds tried as noted above. However dep interfering more.    She refused meds with wt gain potential.    Depression and anxiety are worse bc worsening body image problems.  More avoidant of going out.    She is under a little less stress situationally which has helped.   She has a history of some seasonal depression   Continue lithium 750 mg daily Continue  lamotrigine 150 mg twice daily Continue clonazepam 0.5 mg HS Continue gabapentin 300 mg at 5 PM and nightly Add B complex vitamin for memory, never tried  04/15/22 normal kidney function, Calcium 10.4, normal TSH Repeat labs  Option retry Vraylar low dos or modafinil off label.  Limited options. Vraylar best option 1.5 mg twice weekly.  Option clonidine off label for anxiety or manic irritability.    Counseled patient regarding potential benefits, risks, and side effects of lithium to include potential risk of lithium affecting thyroid and renal function.  Discussed need for periodic lab monitoring to determine drug level and to assess for potential adverse effects.  Counseled patient regarding signs and symptoms of lithium toxicity and advised that they notify office immediately or seek urgent medical attention if experiencing these signs and symptoms.  Patient advised to contact office with any questions or concerns. Her typical lithium levels are in the very low normal range 0.5 because that is about all she can typically tolerate.  FU 8 weeks.  Meredith Staggers, MD, DFAPA  Please see After Visit Summary for patient specific instructions.  No future appointments.     No orders of the defined types were placed in this encounter.      -------------------------------

## 2023-11-12 IMAGING — MG MM DIGITAL DIAGNOSTIC UNILAT*L* W/ TOMO W/ CAD
6 series · 6 of 18 positions shown · non-contrast
Comparison: Multiple prior studies.

CLINICAL DATA: Patient returns after screening for evaluation of
possible LEFT breast asymmetry.

EXAM:
DIGITAL DIAGNOSTIC UNILATERAL LEFT MAMMOGRAM WITH TOMOSYNTHESIS AND
CAD
TECHNIQUE: Left digital diagnostic mammography and breast tomosynthesis was
performed. The images were evaluated with computer-aided detection.

[L CC synth-2D]
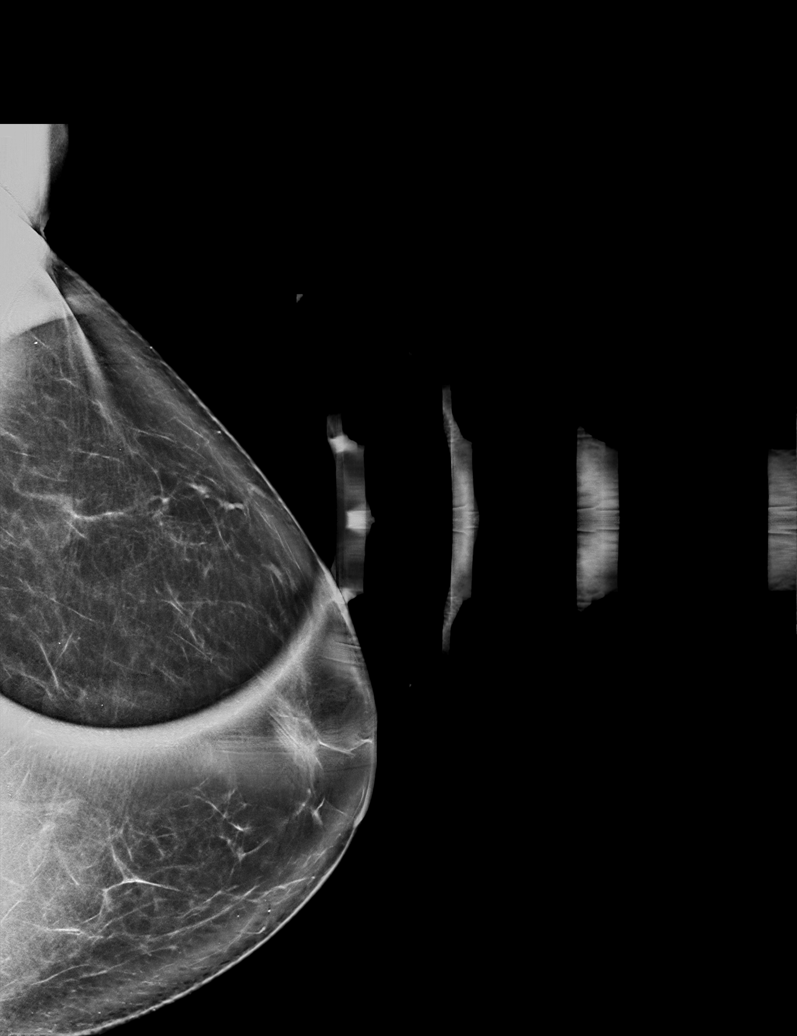

[L MLO synth-2D]
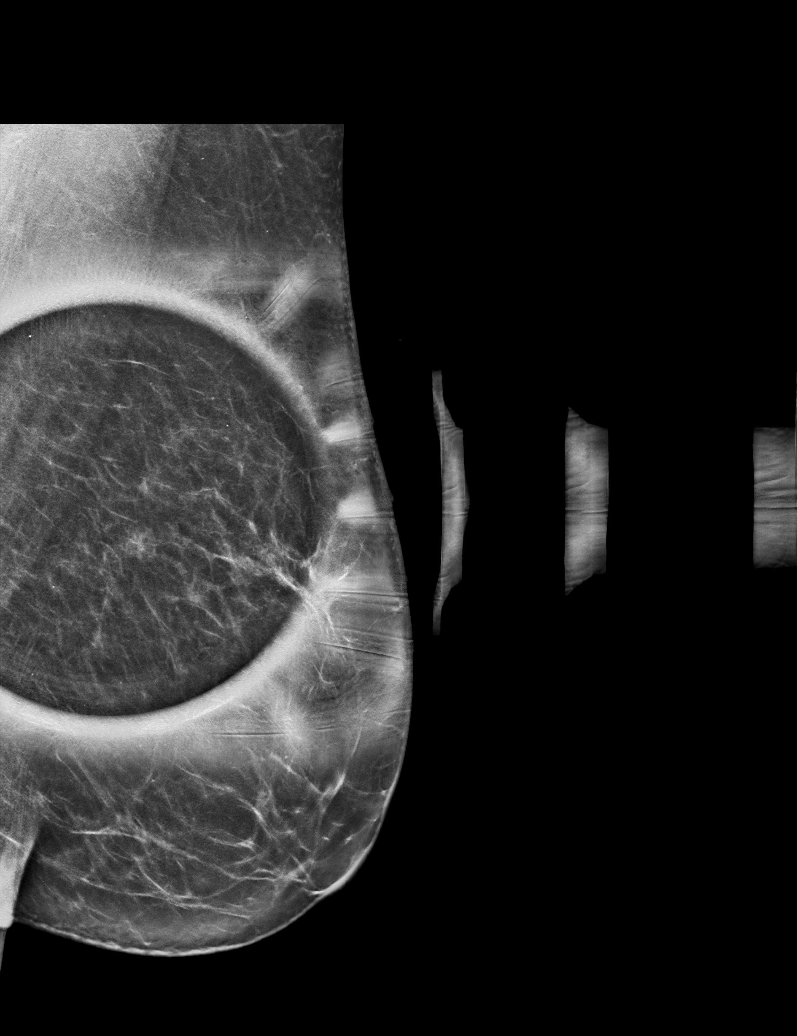

[L ML synth-2D]
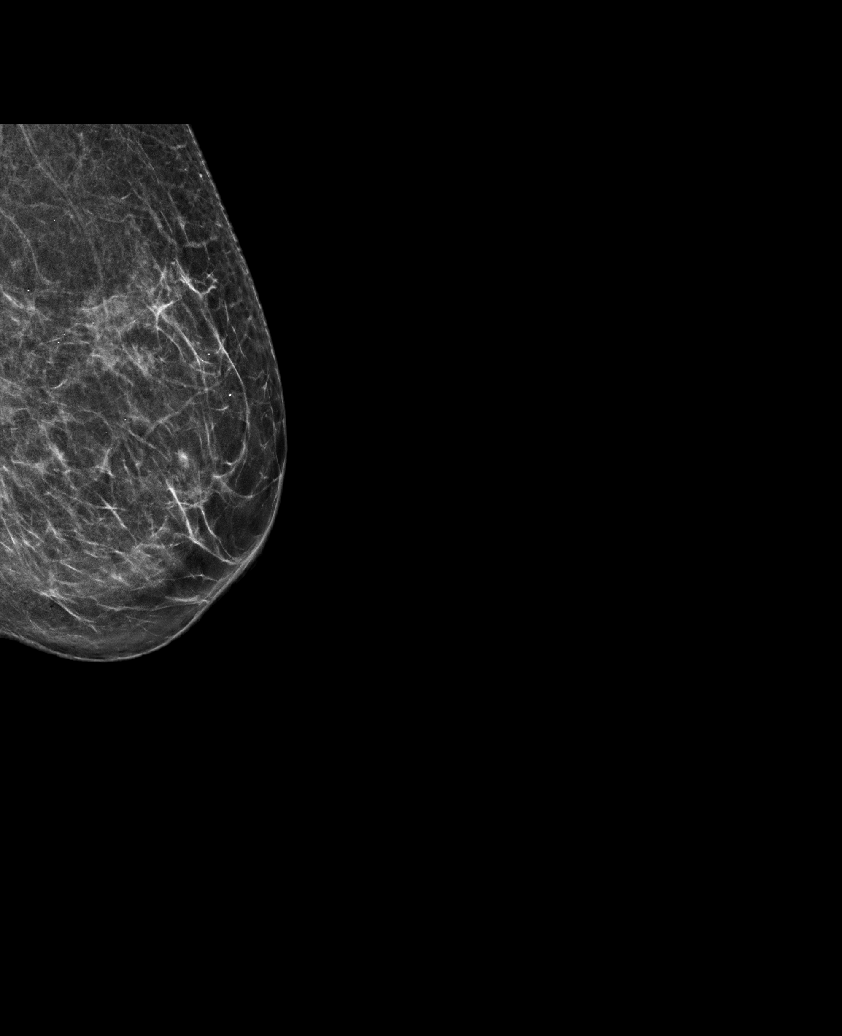

[L ML tomo · tomo slice 33/65.0]
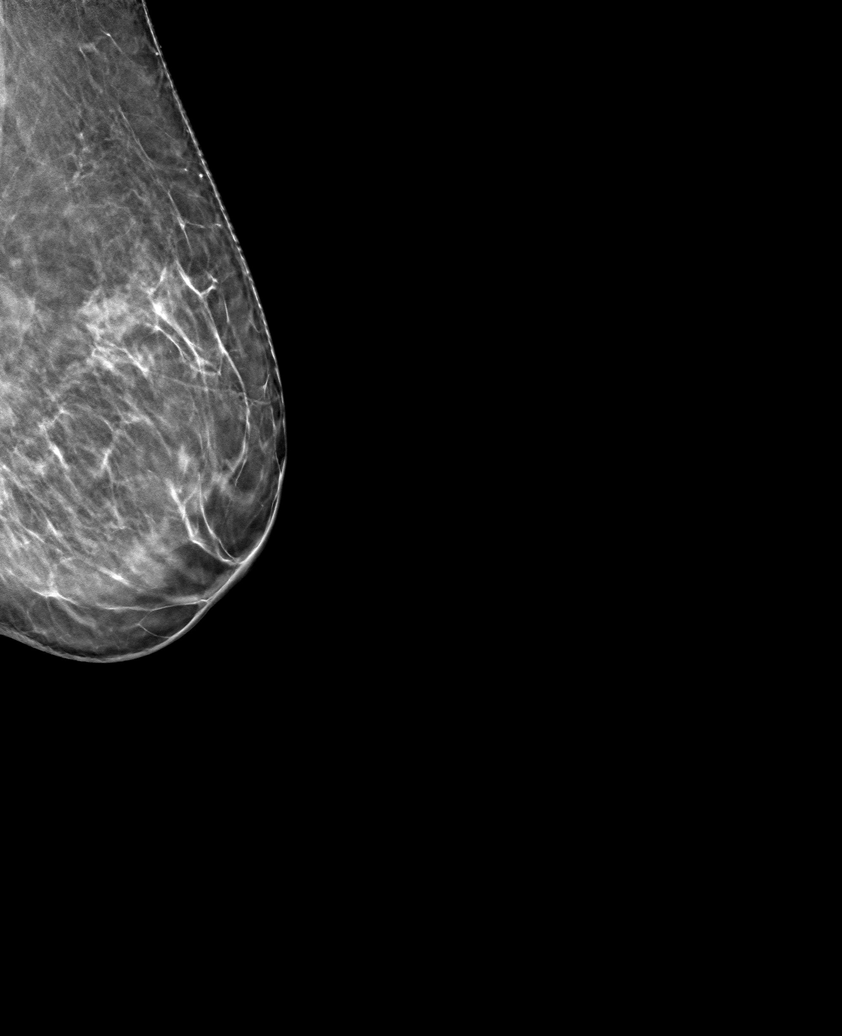

[L CC tomo · tomo slice 38/75.0]
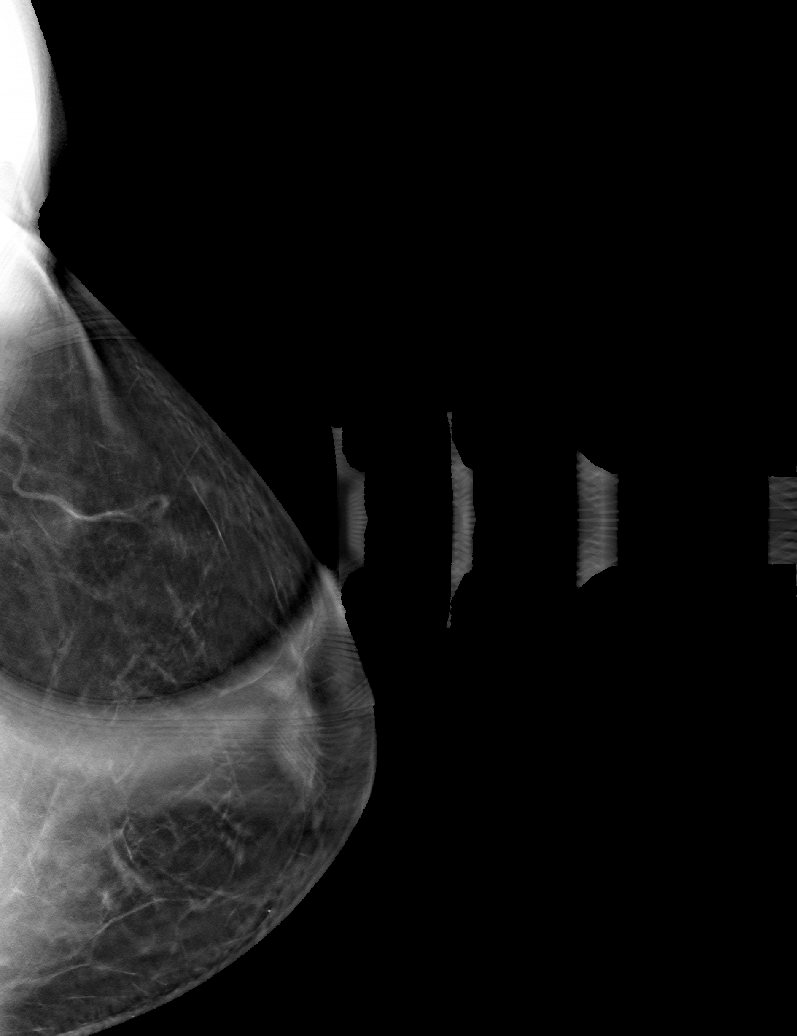

[L MLO tomo · tomo slice 34/67.0]
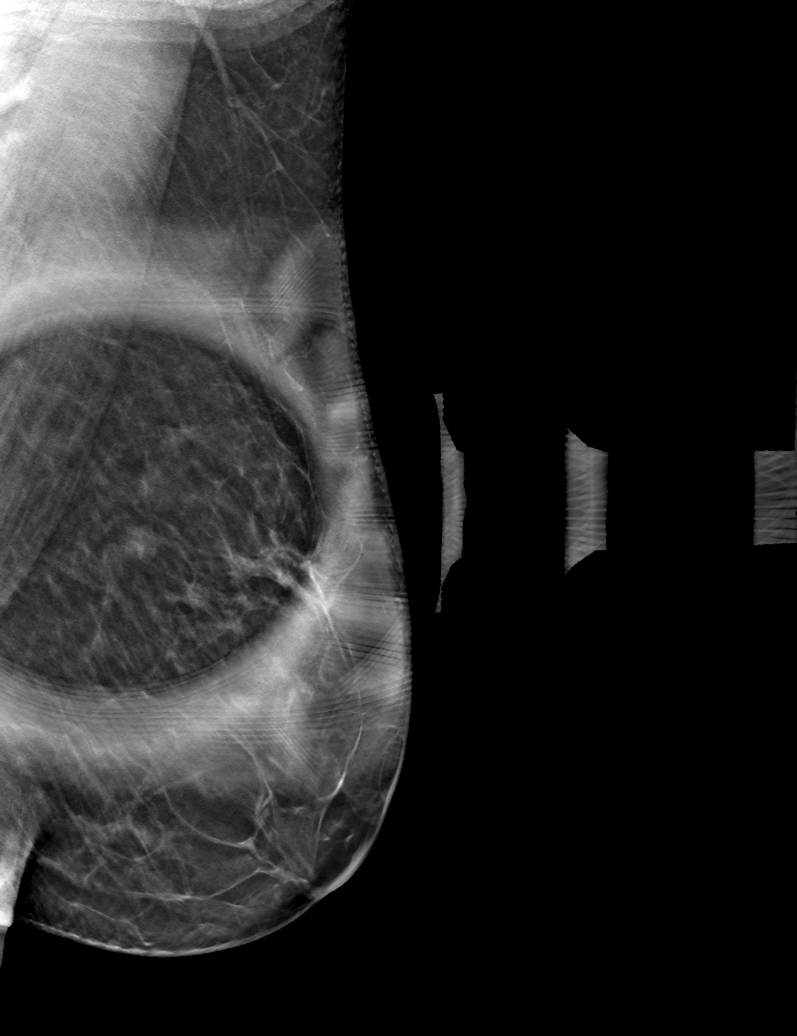

[6 of 18 positions shown; findings below may reference images not displayed]

ACR Breast Density Category b: There are scattered areas of
fibroglandular density.
FINDINGS: Additional 2-D and 3-D images are performed. These views show no
persistent asymmetry in the UPPER portion of the LEFT breast. No
suspicious mass, distortion, or microcalcifications are identified
to suggest presence of malignancy.
IMPRESSION: No mammographic evidence for malignancy.

RECOMMENDATION:
Screening mammogram in one year.(Code:79-3-1K3)

I have discussed the findings and recommendations with the patient.
If applicable, a reminder letter will be sent to the patient
regarding the next appointment.

BI-RADS CATEGORY  2: Benign.

## 2023-12-09 ENCOUNTER — Ambulatory Visit (INDEPENDENT_AMBULATORY_CARE_PROVIDER_SITE_OTHER): Payer: BC Managed Care – PPO

## 2023-12-09 ENCOUNTER — Encounter: Payer: Self-pay | Admitting: Podiatry

## 2023-12-09 ENCOUNTER — Ambulatory Visit (INDEPENDENT_AMBULATORY_CARE_PROVIDER_SITE_OTHER): Payer: Self-pay | Admitting: Podiatry

## 2023-12-09 DIAGNOSIS — M778 Other enthesopathies, not elsewhere classified: Secondary | ICD-10-CM

## 2023-12-09 DIAGNOSIS — G5781 Other specified mononeuropathies of right lower limb: Secondary | ICD-10-CM | POA: Diagnosis not present

## 2023-12-09 DIAGNOSIS — M7751 Other enthesopathy of right foot: Secondary | ICD-10-CM | POA: Diagnosis not present

## 2023-12-09 NOTE — Progress Notes (Signed)
Subjective:  Patient ID: Briana Fox, female    DOB: Sep 23, 1971,  MRN: [id number] HPI Chief Complaint  Patient presents with   Toe Pain    3rd toe right - burning intermittent x months, more frequent now, no injury, no treatment   New Patient (Initial Visit)    Est pt 2018    52 y.o. female presents with the above complaint.   ROS: Denies fever chills nausea vomit muscle aches pains calf pain back pain chest pain shortness of breath  Past Medical History:  Diagnosis Date   Bipolar 1 disorder (HCC)    Cardiac arrhythmia    Depression    Endometriosis    Gestational diabetes    Overweight    RLS (restless legs syndrome)    Past Surgical History:  Procedure Laterality Date   BUNIONECTOMY     CHOLECYSTECTOMY     FOOT SURGERY     LAPAROSCOPY     pulmonary function test  12/24/2010   Normal   TOTAL VAGINAL HYSTERECTOMY  08/1998   tvh    Current Outpatient Medications:    Cholecalciferol 25 MCG (1000 UT) tablet, Take by mouth., Disp: , Rfl:    clonazePAM (KLONOPIN) 0.5 MG tablet, Take 1 tablet (0.5 mg total) by mouth at bedtime., Disp: 30 tablet, Rfl: 2   gabapentin (NEURONTIN) 300 MG capsule, 1 IN EVENING AND 1 AT BEDTIME, Disp: 180 capsule, Rfl: 0   lamoTRIgine (LAMICTAL) 150 MG tablet, Take 1 tablet (150 mg total) by mouth 2 (two) times daily., Disp: 180 tablet, Rfl: 0   levothyroxine (SYNTHROID) 50 MCG tablet, TAKE 1 TABLET BY MOUTH EVERY DAY ON AN EMPTY STOMACH 30-60 MINUTES BEFORE BREAKFAST, Disp: 90 tablet, Rfl: 3   lithium carbonate 150 MG capsule, Take 1 capsule (150 mg total) by mouth daily at 12 noon., Disp: 90 capsule, Rfl: 1   lithium carbonate 300 MG capsule, TAKE 1 CAPSULE (300 MG TOTAL) BY MOUTH 2 (TWO) TIMES DAILY WITH A MEAL., Disp: 180 capsule, Rfl: 1   omeprazole (PRILOSEC) 40 MG capsule, TAKE 1 CAPSULE BY MOUTH EVERY DAY, Disp: 90 capsule, Rfl: 0   pantoprazole (PROTONIX) 40 MG tablet, Take 40 mg by mouth daily., Disp: , Rfl:    topiramate (TOPAMAX)  25 MG tablet, 1 at night for 1 week then 1 BID for 1 week then 1 in the AM and 2 in the PM for 1 week then 2 tablets BID, Disp: 120 tablet, Rfl: 0  No Known Allergies Review of Systems Objective:  There were no vitals filed for this visit.  General: Well developed, nourished, in no acute distress, alert and oriented x3   Dermatological: Skin is warm, dry and supple bilateral. Nails x 10 are well maintained; remaining integument appears unremarkable at this time. There are no open sores, no preulcerative lesions, no rash or signs of infection present.  Vascular: Dorsalis Pedis artery and Posterior Tibial artery pedal pulses are 2/4 bilateral with immedate capillary fill time. Pedal hair growth present. No varicosities and no lower extremity edema present bilateral.   Neruologic: Grossly intact via light touch bilateral. Vibratory intact via tuning fork bilateral. Protective threshold with Semmes Wienstein monofilament intact to all pedal sites bilateral. Patellar and Achilles deep tendon reflexes 2+ bilateral. No Babinski or clonus noted bilateral.   Musculoskeletal: No gross boney pedal deformities bilateral. No pain, crepitus, or limitation noted with foot and ankle range of motion bilateral. Muscular strength 5/5 in all groups tested bilateral.  No reproducible tenderness  on palpation toes 3 and 4 of the right foot  Gait: Unassisted, Nonantalgic.    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  Assessment & Plan:   Assessment: Osteoarthritis capsulitis  Plan: Recommended over-the-counter treatment plan with diclofenac gel I also offered her an injection which she declined.     Jaxn Chiquito T. Clifford, [place]

## 2024-01-04 ENCOUNTER — Telehealth: Payer: Self-pay | Admitting: Psychiatry

## 2024-01-04 DIAGNOSIS — F319 Bipolar disorder, unspecified: Secondary | ICD-10-CM

## 2024-01-04 MED ORDER — LITHIUM CARBONATE 300 MG PO CAPS: 300.0000 mg | ORAL_CAPSULE | Freq: Two times a day (BID) | ORAL | 0 refills | Status: DC

## 2024-01-04 MED ORDER — LITHIUM CARBONATE 150 MG PO CAPS: 150.0000 mg | ORAL_CAPSULE | Freq: Every day | ORAL | 0 refills | Status: DC

## 2024-01-04 NOTE — Telephone Encounter (Signed)
 Bradly Chris called at 10:25 to request refill of her Lithium.  Next appt 4/22.  Send to [facility] - [place], [place] - [address]

## 2024-01-04 NOTE — Telephone Encounter (Signed)
 Lithium 750 mg sent to [facility].

## 2024-01-07 ENCOUNTER — Ambulatory Visit: Payer: BC Managed Care – PPO | Admitting: Psychiatry

## 2024-03-22 ENCOUNTER — Other Ambulatory Visit: Payer: Self-pay

## 2024-03-22 DIAGNOSIS — F319 Bipolar disorder, unspecified: Secondary | ICD-10-CM

## 2024-03-22 MED ORDER — LAMOTRIGINE 150 MG PO TABS: 150.0000 mg | ORAL_TABLET | Freq: Two times a day (BID) | ORAL | 0 refills | Status: DC

## 2024-03-30 ENCOUNTER — Other Ambulatory Visit: Payer: Self-pay | Admitting: Internal Medicine

## 2024-03-30 DIAGNOSIS — Z1231 Encounter for screening mammogram for malignant neoplasm of breast: Secondary | ICD-10-CM

## 2024-04-12 ENCOUNTER — Encounter: Payer: Self-pay | Admitting: Psychiatry

## 2024-04-12 ENCOUNTER — Ambulatory Visit (INDEPENDENT_AMBULATORY_CARE_PROVIDER_SITE_OTHER): Payer: BC Managed Care – PPO | Admitting: Psychiatry

## 2024-04-12 DIAGNOSIS — F4010 Social phobia, unspecified: Secondary | ICD-10-CM

## 2024-04-12 DIAGNOSIS — F411 Generalized anxiety disorder: Secondary | ICD-10-CM

## 2024-04-12 DIAGNOSIS — F319 Bipolar disorder, unspecified: Secondary | ICD-10-CM

## 2024-04-12 DIAGNOSIS — F902 Attention-deficit hyperactivity disorder, combined type: Secondary | ICD-10-CM

## 2024-04-12 DIAGNOSIS — F5105 Insomnia due to other mental disorder: Secondary | ICD-10-CM

## 2024-04-12 DIAGNOSIS — F4522 Body dysmorphic disorder: Secondary | ICD-10-CM

## 2024-04-12 MED ORDER — LITHIUM CARBONATE 150 MG PO CAPS: 150.0000 mg | ORAL_CAPSULE | Freq: Every day | ORAL | 1 refills | Status: DC

## 2024-04-12 MED ORDER — CLONIDINE HCL 0.1 MG PO TABS: 0.1000 mg | ORAL_TABLET | Freq: Two times a day (BID) | ORAL | 1 refills | Status: DC

## 2024-04-12 MED ORDER — LAMOTRIGINE 150 MG PO TABS: 150.0000 mg | ORAL_TABLET | Freq: Two times a day (BID) | ORAL | 1 refills | Status: DC

## 2024-04-12 MED ORDER — GABAPENTIN 300 MG PO CAPS: ORAL_CAPSULE | ORAL | 1 refills | Status: DC

## 2024-04-12 MED ORDER — CLONAZEPAM 0.5 MG PO TABS: 0.5000 mg | ORAL_TABLET | Freq: Every day | ORAL | 2 refills | Status: DC

## 2024-04-12 MED ORDER — LITHIUM CARBONATE 300 MG PO CAPS: 300.0000 mg | ORAL_CAPSULE | Freq: Two times a day (BID) | ORAL | 1 refills | Status: DC

## 2024-04-12 NOTE — Progress Notes (Signed)
 Briana Fox [id number] [birth year] 53 y.o.    Subjective:   Patient ID:  Briana Fox is a 53 y.o. (DOB [birth year]) female.  Chief Complaint:  Chief Complaint  Patient presents with   Follow-up   Depression   Anxiety    Briana Fox presents to the office today for follow-up of depression and anxiety and binge eating disorder.    When seen May 09, 2019.  She had marked improvement in the past with regard to mood and binge eating with Vyvanse  but could not tolerate the dry mouth and discontinued it.  So at the last appointment we prescribed Concerta  as the next best most similar long-acting stimulant option.  However she called back stating she cannot afford that 1.  Therefore Ritalin -SR 20 mg 1 every morning was sent in and it didn't work.    visit was July 25, 2019 and we increased Ritalin -SR to 20 mg twice daily for residual depression, attention problems, and binge eating off label.  Didn't work out bc cost too much and it didn't help much.  seen September 2020.  No meds were changed.  She had stopped the stimulant because it was not very helpful and was expensive.  March 2021 appointment the following is noted: Getting over Covid now.  Dx 2/23 sx started like the flu. No fever.  Sx remain fatigue and cough. For the most part mood been fair and satisfactory since here.  Fine with the meds. Situation calmer with less demands on her with only usually 1 child at a time.  Helped her mood.  Some depression with situations with several deaths from the Covid.  Still dances but does it outside.  1 of the people in the group died.  Mood still with ups and downs but not severe.  Some depression..  She thinks menopause and struggling with age bc turning 53 yo soon the age when her Mother died.  Sister claims M overdosed.  Patient doesn't know. [name] and describes anxiety as less than before mild- Moderate. Anxiety symptoms include: Excessive  Worry, Panic Symptoms,.   Pt reports no sleep issues. Pt reports that appetite is good. Pt reports that energy is poor and down slightly. Concentration is poor. Forgetful.  Suicidal thoughts:  denied by patient. No med changes  08/02/2020 appointment the following is noted: Still doesn't have smell back. Lots of stress keeping 4 gkids.  But it will drop to 2 when they start school.  At times will notice feeling small amount of rage without a reason.  It's very irritating but brief.  Sometimes out of the blue. Very scared of Covid but hasn't gotten vaccine. Takes clonazepam  at night mainly and asks to increase it bc initial and terminal.  Stay stressed.  My weight consumes me always and will think of it at night for sleep. Some concerns about memory. Clonazepam  0.5 mg 1/4 tablet in the morning and 1/2 tablet at night.  Can't fully stop am doses. Sleep is Ok Put on weight bc binge eating chocolate DT depression.  Binge eating is out of control.  Less on meds and less on weekends. Plan: Continue lithium  750 mg daily Continue lamotrigine  150 mg twice daily Switch to alprazolam  to improve sleep 0.25-0.5 mg HS and 0.25 mg prn anxiety Continue gabapentin  300 mg at 5 PM and nightly Add B complex vitamin for memory  01/30/2021 appointment with the following noted: Goes dancing weekly but otherwise doesn't want to leave  the house. Not sure if she's scared of Covid but doesn't like leaving the house.  More down and anxious over Covid.  Knows a number of people to die.  It's drug on and on. Sleep good here lately.  Tired lately.   Keeping gkids. Taking clonazepam  0.5 mg HS and not alprazolam .  07/22/2021 appt noted: Overall pretty good. Lost 15# but self esteem is not better.  Cut back eating and exercising.  Struggles with her sense of attractiveness.  Buys too many clothes and then doesn't like the way she looks. Drags her mood down. Been a problem for her since 2nd grade. Keeping gkids, 16 mos and gson  55 yo is difficult. D is home schooling. Less hot flashes with gabapentin  Plan: Continue lithium  750 mg daily Continue lamotrigine  150 mg twice daily Continue clonazepam  0.5 mg HS Continue gabapentin  300 mg at 5 PM and nightly Add B complex vitamin for memory, never tried  01/22/22 appt noted: Some seasonal depression.  Not severe.   No SE problems. Some chronic anxiety bu off and on and can panic over something she said or did.  Off and on all day will say she hates herself. Can't sleep without clonazepam   07/31/22 appt noted: Pretty good with less mood swings than before. Depression comes and goes some.  Need to resume therapy.  Situational. Sleep good with meds. Anxiety is so so.  Wouldn't go out to eat at beach.  Still goes dancing bc feels secure there but won't go out to eat with lots of people.  Self esteem issue.  Knows she needs to work on this in therapy.   No SE.   No driving in 3 years DT anxiety.  Need to start back but scared. Always issues with driving.   A couple panic for no reason but usually if unfamiliar situations. Panic over trigger of helicopter [employer] recently near bc one picked up her child that died years ago. Other things can trigger panic like smells that remind her of D that died. Plan no med changes Continue clonazepam  0.5 mg daily as needed for anxiety, gabapentin  300 mg in the evening and at bedtime, lamotrigine  150 mg twice daily, lithium  750 mg nightly  03/25/2023 appointment noted: Continues meds above and takes clonazepam  0.5 mg HS to sleep Sleep is good A little more depression and anxiety.  Through the winter. In therapy with Dr. Caroleen Churn.  Rotten self esteem so often won't go out of the house. Trouble adjusting summer bc wearing less clothing makes her feel humongous. Laying awake at night worrying about having to go somewhere tomorrow. Quit driving DT anxiety. Scared of expressing herself.  Doesn't think she can get words out correctly. Some  irritability.  Menopausal. Plan: (NAC) N-Acetylcysteine 1 of the  600 mg capsules twice dailyfor 1 week then 2 twice daily to help with mild cognitive problems and body image issues.   Auvelity 1 in the AM for 1 week then 1 twice daily for depression. If no benefit then call back and I'll RX naltrexone  for the body image issue and weight loss  04/02/23 TC:  Patient c/o dry mouth with Auvelity that is causing her anxiety. She said you would Rx naltrexone  if the Achille didn't work.       MD resp:  I agree with her request.  Have her stop the Auvelity.  Please bring any remaining samples she has back at her next appointment.  I sent in the prescription for the naltrexone  and  the instructions will be on the bottle.     06/16/23 appt noted: Benefit naltrexone  initially with binge eating but gradually lost the benefit.  Does help craving for coffee. No sign SE  now .  Initially had N.  Wants help with wt if possible and wanted to retry Wellb with naltrexone .  Mood swings and feeling out of control often.  Often tied to neg self talk.   Stress F witout water and helping him.   Wants to lose 10#.  Plan: Start topiramate  25 mg daily and gradually increased to 50 mg twice daily.    09/15/23 appt noted: Meds: Continue clonazepam  0.5 mg daily as needed for anxiety, gabapentin  300 mg in the evening and at bedtime, lamotrigine  150 mg twice daily, lithium  750 mg nightly, off naltrexone  Couldn't afford topiramate .   Noticed struggling more with dep.  Some related to weight problems.  Got to the point that I don't want to go dancing and "that means there's a problem." Can tell if she is getting manic.  But dep now.   Sleep good with clonazepam  but not without it. Start pramipexole  0.5 mg tablet:  1/2 tablet twice daily for 1 week, then 1 tablet twice daily for 1 week, then 1-1/2 tablets twice daily  09/24/23  From MyChart  I haven't been able to take a half of the pill pramipexole . It makes me real  sleepy. I am trying to take a quarter of the pill twice day. It still makes me sleepy and not as bad with the half. Do you think this is ok to try it this way.     MD: one quarter of a tablet all at night.  Then once that is tolerated try 1/4 tablet twice daily for a few days and if tolerated increase to 1/4 tablet in the morning and 1/2 tablet at night.  I can get her a lower dosage if needed.  It sounds like she is more sensitive than average.   10/29/23 appt noted: Complaining of dep and anxiety.  Latter getting worse. Didn't tolerate even tiny dose of pramipexole . No other changes since she was here.   Plan: Vraylar 1.5 mg twice weekly.  04/12/24 appt noted: Med: clonazepam  0.5 mg HS, gabapentin  300 HS, lithium  150 mg + 300 mg BID, lamotrigine  150 BID, topiramate  25 mg 2 BID Never took Vraylar bc he dx bladder CA and she was helping him.  He's doing well.  Didn't want to start anything new. Dep is better.  Anxiety is chief concern now.  Keeps her addled.  Can't relax.  Also worry, more mental than anything.  Living on the edge.  No trigger known.   No health px.   Poor memory. Lost 20# this year using [employer]. Dependent on H.   Failed psychiatric medication trials include lithium   Lamotrigine  300, Trileptal, carbamazepine,  SSRIs,  sertraline,  duloxetine Buspirone 2017 bupropion, Auvelity SE anxiety Vraylar, Rexulti, Geodon, Latuda, Saphris, Abilify 10 Pramipexole  couldn't tolerate DT sleepiness topiramate  Vyvanse  40 milligrams effective but dry mouth.  Ritalin  SR 40 NR.   Clonazepam   She also refuses meds that have weight gain risk.   Has tried propranolol prn for anxiety and can tell a touch of difference at 40 mg/D.  Review of Systems:  Review of Systems  Cardiovascular:  Negative for chest pain and palpitations.  Gastrointestinal:  Negative for abdominal pain.  Musculoskeletal:  Positive for arthralgias.  Neurological:  Negative for tremors.  Psychiatric/Behavioral:   Positive for  decreased concentration and depression. Negative for agitation, confusion, dysphoric mood, hallucinations, self-injury, sleep disturbance and suicidal ideas. The patient is nervous/anxious. The patient is not hyperactive.     Medications: I have reviewed the patient's current medications.  Current Outpatient Medications  Medication Sig Dispense Refill   Cholecalciferol 25 MCG (1000 UT) tablet Take by mouth.     clonazePAM  (KLONOPIN ) 0.5 MG tablet Take 1 tablet (0.5 mg total) by mouth at bedtime. 30 tablet 2   gabapentin  (NEURONTIN ) 300 MG capsule 1 IN EVENING AND 1 AT BEDTIME 180 capsule 1   lamoTRIgine  (LAMICTAL ) 150 MG tablet Take 1 tablet (150 mg total) by mouth 2 (two) times daily. 180 tablet 1   levothyroxine  (SYNTHROID ) 50 MCG tablet TAKE 1 TABLET BY MOUTH EVERY DAY ON AN EMPTY STOMACH 30-60 MINUTES BEFORE BREAKFAST 90 tablet 3   lithium  carbonate 150 MG capsule Take 1 capsule (150 mg total) by mouth daily at 12 noon. 90 capsule 1   lithium  carbonate 300 MG capsule Take 1 capsule (300 mg total) by mouth 2 (two) times daily with a meal. 180 capsule 1   omeprazole  (PRILOSEC) 40 MG capsule TAKE 1 CAPSULE BY MOUTH EVERY DAY 90 capsule 0   pantoprazole (PROTONIX) 40 MG tablet Take 40 mg by mouth daily.     cloNIDine  (CATAPRES ) 0.1 MG tablet Take 1 tablet (0.1 mg total) by mouth 2 (two) times daily. (Patient not taking: Reported on 04/12/2024) 60 tablet 1   No current facility-administered medications for this visit.    Medication Side Effects: dry mouth.  Allergies: No Known Allergies  Past Medical History:  Diagnosis Date   Bipolar 1 disorder (HCC)    Cardiac arrhythmia    Depression    Endometriosis    Gestational diabetes    Overweight    RLS (restless legs syndrome)     Family History  Problem Relation Age of Onset   Heart disease Mother    Rheum arthritis Mother    Diabetes Father    Endometriosis Sister    Diabetes Sister    Breast cancer Maternal Aunt     Breast cancer Maternal Grandmother    Colon cancer Neg Hx    Ovarian cancer Neg Hx     Social History   Socioeconomic History   Marital status: Married    Spouse name: Not on file   Number of children: 2   Years of education: Not on file   Highest education level: Not on file  Occupational History    Comment: Unemployed. 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  Tobacco Use   Smoking status: Never   Smokeless tobacco: Never  Vaping Use   Vaping status: Never Used  Substance and Sexual Activity   Alcohol use: Yes    Alcohol/week: 0.0 standard drinks of alcohol    Comment: occas   Drug use: No   Sexual activity: Yes    Birth control/protection: Surgical  Other Topics Concern   Not on file  Social History Narrative   Not on file   Social Drivers of Health   Financial Resource Strain: Low Risk  (03/30/2024)   Received from [facility]   Overall Financial Resource Strain (CARDIA)    Difficulty of Paying Living Expenses: Not hard at all  Food Insecurity: No Food Insecurity (03/30/2024)   [facility]   Hunger Vital Sign    Worried About Running Out of Food in the Last Year: Never true  Ran Out of Food in the Last Year: Never true  Transportation Needs: No Transportation Needs (03/30/2024)   Received from [facility] - Transportation    In the past 12 months, has lack of transportation kept you from medical appointments or from getting medications?: No    Lack of Transportation (Non-Medical): No  Physical Activity: Not on file  Stress: Not on file  Social Connections: Not on file  Intimate Partner Violence: Not on file    Past Medical History, Surgical history, Social history, and Family history were reviewed and updated as appropriate.   Caffeine 6 cups/D bc I'm sleepy.  Please see review of systems for further details on the patient's review from today.   Objective:   Physical Exam:   There were no vitals taken for this visit.  Physical Exam Constitutional:      General: She is not in acute distress. Musculoskeletal:        General: No deformity.  Neurological:     Mental Status: She is alert and oriented to person, place, and time.     Cranial Nerves: No dysarthria.     Coordination: Coordination normal.  Psychiatric:        Attention and Perception: Attention and perception normal. She does not perceive auditory or visual hallucinations.        Mood and Affect: Mood is anxious. Mood is not depressed. Affect is not labile, blunt, angry or tearful.        Speech: Speech normal.        Behavior: Behavior normal. Behavior is cooperative.        Thought Content: Thought content normal. Thought content is not paranoid or delusional. Thought content does not include homicidal or suicidal ideation. Thought content does not include suicidal plan.        Cognition and Memory: Cognition and memory normal.        Judgment: Judgment normal.     Comments: Insight fair.  Residual depression better with Spring More avoidant        Lab Review:     Component Value Date/Time   NA 141 10/24/2020 0858   K 4.5 10/24/2020 0858   CL 106 10/24/2020 0858   CO2 22 10/24/2020 0858   GLUCOSE 94 10/24/2020 0858   BUN 20 10/24/2020 0858   CREATININE 0.65 10/24/2020 0858   CALCIUM 11.0 (H) 10/24/2020 0858   PROT 6.7 10/24/2020 0858   ALBUMIN 4.8 10/24/2020 0858   AST 14 10/24/2020 0858   ALT 13 10/24/2020 0858   ALKPHOS 45 10/24/2020 0858   BILITOT <0.2 10/24/2020 0858   GFRNONAA 103 10/24/2020 0858   GFRAA 119 10/24/2020 0858       Component Value Date/Time   WBC 7.4 10/24/2020 0858   RBC 4.13 10/24/2020 0858   HGB 11.7 10/24/2020 0858   HCT 35.8 10/24/2020 0858   PLT 312 10/24/2020 0858   MCV 87 10/24/2020 0858   MCH 28.3 10/24/2020 0858   MCHC 32.7 10/24/2020 0858   RDW 13.7 10/24/2020 0858    Lithium  Lvl  Date Value Ref Range Status  08/01/2021 0.48 (L)  0.60 - 1.20 mmol/L Final    Comment:    Performed at [facility], [address] [place], [place] [address]     No results found for: "PHENYTOIN", "PHENOBARB", "VALPROATE", "CBMZ"   .res Assessment: Plan:    Willean "Briana Fox" was seen today for follow-up, depression and anxiety.  Diagnoses and all orders  for this visit:  Bipolar affective disorder, rapid cycling (HCC) -     lithium  carbonate 150 MG capsule; Take 1 capsule (150 mg total) by mouth daily at 12 noon. -     lithium  carbonate 300 MG capsule; Take 1 capsule (300 mg total) by mouth 2 (two) times daily with a meal. -     lamoTRIgine  (LAMICTAL ) 150 MG tablet; Take 1 tablet (150 mg total) by mouth 2 (two) times daily.  Social anxiety disorder -     cloNIDine  (CATAPRES ) 0.1 MG tablet; Take 1 tablet (0.1 mg total) by mouth 2 (two) times daily. (Patient not taking: Reported on 04/12/2024)  Generalized anxiety disorder -     cloNIDine  (CATAPRES ) 0.1 MG tablet; Take 1 tablet (0.1 mg total) by mouth 2 (two) times daily. (Patient not taking: Reported on 04/12/2024) -     gabapentin  (NEURONTIN ) 300 MG capsule; 1 IN EVENING AND 1 AT BEDTIME  Attention deficit hyperactivity disorder (ADHD), combined type  Body image disorder  Insomnia due to mental condition -     clonazePAM  (KLONOPIN ) 0.5 MG tablet; Take 1 tablet (0.5 mg total) by mouth at bedtime.     Probably PTSD too. 30 min of face to face time with patient was spent on counseling and coordination of care. 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  Failed multiple other meds.    Disc anxiety and how she's more avoidant.  Need to work on this.  Chronic issues with staying on meds.  Some fear of meds and fear of SE.  We discussed the short-term risks associated with benzodiazepines including sedation and increased fall risk among others.  Discussed long-term side effect risk including dependence, potential withdrawal symptoms, and the potential eventual dose-related risk  of dementia.  She is on a very low dosage and is not having significant memory complaints.  New studies refute risk.  But disc risk short term memory trouble.  Need to work on positive self talk bc she demeans herself. 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  Needs to get back to driving.  She plans ro continue therapy.  Have disc weight loss meds Ozempic etc. Body image issues since 2nd grade.   Consider off label naltrexone , NAC, topiramate   Overall her bipolar symptoms are not extreme.  She has chronic mild rapid cycling and spends more time depressed than up. we have been unable to completely manage this with multiple meds tried as noted above. However dep interfering more.    She refused meds with wt gain potential.  Dep better with time change and warmer weather.   More avoidant of going out.   She has a history of some seasonal depression   Continue lithium  750 mg daily Continue lamotrigine  150 mg twice daily Continue clonazepam  0.5 mg HS Continue gabapentin  300 mg at 5 PM and nightly Add B complex vitamin for memory, never tried  04/15/22 normal kidney function, Calcium 10.4, normal TSH Repeat labs  Ok trial clonidine  off label prn anxiety 0.1 mg twice daily Disc SE Option clonidine  off label for anxiety or manic irritability.  Yes for anxiety.   Counseled patient regarding potential benefits, risks, and side effects of lithium  to include potential risk of lithium  affecting thyroid  and renal function.  Discussed need for periodic lab monitoring to determine drug level and to assess for potential adverse effects.  Counseled patient regarding signs and symptoms of lithium  toxicity and advised that they notify office immediately or seek urgent medical attention  if experiencing these signs and symptoms.  Patient advised to contact office with any questions or concerns. Her typical lithium  levels are in the very low normal range 0.5 because that is about all she can  typically tolerate.  FU 8 weeks.  Nori Beat, MD, DFAPA  Please see After Visit Summary for patient specific instructions.  Future Appointments  Date Time Provider Department Center  06/16/2024  2:00 PM Cottle, Kennedy Peabody., MD [facility] None       No orders of the defined types were placed in this encounter.      -------------------------------

## 2024-05-06 ENCOUNTER — Other Ambulatory Visit: Payer: Self-pay | Admitting: Psychiatry

## 2024-05-06 DIAGNOSIS — F411 Generalized anxiety disorder: Secondary | ICD-10-CM

## 2024-05-06 DIAGNOSIS — F4010 Social phobia, unspecified: Secondary | ICD-10-CM

## 2024-06-16 ENCOUNTER — Ambulatory Visit: Admitting: Psychiatry

## 2024-06-16 ENCOUNTER — Encounter: Payer: Self-pay | Admitting: Psychiatry

## 2024-06-16 DIAGNOSIS — F319 Bipolar disorder, unspecified: Secondary | ICD-10-CM | POA: Diagnosis not present

## 2024-06-16 DIAGNOSIS — F5105 Insomnia due to other mental disorder: Secondary | ICD-10-CM

## 2024-06-16 DIAGNOSIS — F902 Attention-deficit hyperactivity disorder, combined type: Secondary | ICD-10-CM

## 2024-06-16 DIAGNOSIS — F411 Generalized anxiety disorder: Secondary | ICD-10-CM | POA: Diagnosis not present

## 2024-06-16 DIAGNOSIS — F4010 Social phobia, unspecified: Secondary | ICD-10-CM | POA: Diagnosis not present

## 2024-06-16 DIAGNOSIS — Z79899 Other long term (current) drug therapy: Secondary | ICD-10-CM

## 2024-06-16 MED ORDER — CLONAZEPAM 0.5 MG PO TABS
ORAL_TABLET | ORAL | 3 refills | Status: AC
Start: 2024-06-16 — End: ?

## 2024-06-16 NOTE — Progress Notes (Signed)
 Briana Fox [id number] 1971-07-19 53 y.o.    Subjective:   Patient ID:  Briana Fox is a 53 y.o. (DOB 12-11-1971) female.  Chief Complaint:  Chief Complaint  Patient presents with   Follow-up   Depression   Anxiety   Sleeping Problem   Medication Reaction    Briana Fox presents to the office today for follow-up of depression and anxiety and binge eating disorder.    When seen May 09, 2019.  She had marked improvement in the past with regard to mood and binge eating with Vyvanse  but could not tolerate the dry mouth and discontinued it.  So at the last appointment we prescribed Concerta  as the next best most similar long-acting stimulant option.  However she called back stating she cannot afford that 1.  Therefore Ritalin -SR 20 mg 1 every morning was sent in and it didn't work.    visit was July 25, 2019 and we increased Ritalin -SR to 20 mg twice daily for residual depression, attention problems, and binge eating off label.  Didn't work out bc cost too much and it didn't help much.  seen September 2020.  No meds were changed.  She had stopped the stimulant because it was not very helpful and was expensive.  March 2021 appointment the following is noted: Getting over Covid now.  Dx 2/23 sx started like the flu. No fever.  Sx remain fatigue and cough. For the most part mood been fair and satisfactory since here.  Fine with the meds. Situation calmer with less demands on her with only usually 1 child at a time.  Helped her mood.  Some depression with situations with several deaths from the Covid.  Still dances but does it outside.  1 of the people in the group died.  Mood still with ups and downs but not severe.  Some depression..  She thinks menopause and struggling with age bc turning 53 yo soon the age when her Mother died.  Sister claims M overdosed.  Patient doesn't know. [name] and describes anxiety as less than before mild- Moderate.  Anxiety symptoms include: Excessive Worry, Panic Symptoms,.   Pt reports no sleep issues. Pt reports that appetite is good. Pt reports that energy is poor and down slightly. Concentration is poor. Forgetful.  Suicidal thoughts:  denied by patient. No med changes  08/02/2020 appointment the following is noted: Still doesn't have smell back. Lots of stress keeping 4 gkids.  But it will drop to 2 when they start school.  At times will notice feeling small amount of rage without a reason.  It's very irritating but brief.  Sometimes out of the blue. Very scared of Covid but hasn't gotten vaccine. Takes clonazepam  at night mainly and asks to increase it bc initial and terminal.  Stay stressed.  My weight consumes me always and will think of it at night for sleep. Some concerns about memory. Clonazepam  0.5 mg 1/4 tablet in the morning and 1/2 tablet at night.  Can't fully stop am doses. Sleep is Ok Put on weight bc binge eating chocolate DT depression.  Binge eating is out of control.  Less on meds and less on weekends. Plan: Continue lithium  750 mg daily Continue lamotrigine  150 mg twice daily Switch to alprazolam  to improve sleep 0.25-0.5 mg HS and 0.25 mg prn anxiety Continue gabapentin  300 mg at 5 PM and nightly Add B complex vitamin for memory  01/30/2021 appointment with the following noted: Goes  dancing weekly but otherwise doesn't want to leave the house. Not sure if she's scared of Covid but doesn't like leaving the house.  More down and anxious over Covid.  Knows a number of people to die.  It's drug on and on. Sleep good here lately.  Tired lately.   Keeping gkids. Taking clonazepam  0.5 mg HS and not alprazolam .  07/22/2021 appt noted: Overall pretty good. Lost 15# but self esteem is not better.  Cut back eating and exercising.  Struggles with her sense of attractiveness.  Buys too many clothes and then doesn't like the way she looks. Drags her mood down. Been a problem for her since 2nd  grade. Keeping gkids, 16 mos and gson 55 yo is difficult. D is home schooling. Less hot flashes with gabapentin  Plan: Continue lithium  750 mg daily Continue lamotrigine  150 mg twice daily Continue clonazepam  0.5 mg HS Continue gabapentin  300 mg at 5 PM and nightly Add B complex vitamin for memory, never tried  01/22/22 appt noted: Some seasonal depression.  Not severe.   No SE problems. Some chronic anxiety bu off and on and can panic over something she said or did.  Off and on all day will say she hates herself. Can't sleep without clonazepam   07/31/22 appt noted: Pretty good with less mood swings than before. Depression comes and goes some.  Need to resume therapy.  Situational. Sleep good with meds. Anxiety is so so.  Wouldn't go out to eat at beach.  Still goes dancing bc feels secure there but won't go out to eat with lots of people.  Self esteem issue.  Knows she needs to work on this in therapy.   No SE.   No driving in 3 years DT anxiety.  Need to start back but scared. Always issues with driving.   A couple panic for no reason but usually if unfamiliar situations. Panic over trigger of helicopter [employer] recently near bc one picked up her child that died years ago. Other things can trigger panic like smells that remind her of D that died. Plan no med changes Continue clonazepam  0.5 mg daily as needed for anxiety, gabapentin  300 mg in the evening and at bedtime, lamotrigine  150 mg twice daily, lithium  750 mg nightly  03/25/2023 appointment noted: Continues meds above and takes clonazepam  0.5 mg HS to sleep Sleep is good A little more depression and anxiety.  Through the winter. In therapy with Dr. Marijean.  Rotten self esteem so often won't go out of the house. Trouble adjusting summer bc wearing less clothing makes her feel humongous. Laying awake at night worrying about having to go somewhere tomorrow. Quit driving DT anxiety. Scared of expressing herself.  Doesn't think  she can get words out correctly. Some irritability.  Menopausal. Plan: (NAC) N-Acetylcysteine 1 of the  600 mg capsules twice dailyfor 1 week then 2 twice daily to help with mild cognitive problems and body image issues.   Auvelity 1 in the AM for 1 week then 1 twice daily for depression. If no benefit then call back and I'll RX naltrexone  for the body image issue and weight loss  04/02/23 TC:  Patient c/o dry mouth with Auvelity that is causing her anxiety. She said you would Rx naltrexone  if the [place]ell didn't work.       MD resp:  I agree with her request.  Have her stop the Auvelity.  Please bring any remaining samples she has back at her next appointment.  I  sent in the prescription for the naltrexone  and the instructions will be on the bottle.     06/16/23 appt noted: Benefit naltrexone  initially with binge eating but gradually lost the benefit.  Does help craving for coffee. No sign SE  now .  Initially had N.  Wants help with wt if possible and wanted to retry Wellb with naltrexone .  Mood swings and feeling out of control often.  Often tied to neg self talk.   Stress F witout water and helping him.   Wants to lose 10#.  Plan: Start topiramate  25 mg daily and gradually increased to 50 mg twice daily.    09/15/23 appt noted: Meds: Continue clonazepam  0.5 mg daily as needed for anxiety, gabapentin  300 mg in the evening and at bedtime, lamotrigine  150 mg twice daily, lithium  750 mg nightly, off naltrexone  Couldn't afford topiramate .   Noticed struggling more with dep.  Some related to weight problems.  Got to the point that I don't want to go dancing and that means there's a problem. Can tell if she is getting manic.  But dep now.   Sleep good with clonazepam  but not without it. Start pramipexole  0.5 mg tablet:  1/2 tablet twice daily for 1 week, then 1 tablet twice daily for 1 week, then 1-1/2 tablets twice daily  09/24/23  From MyChart  I haven't been able to take a half of the  pill pramipexole . It makes me real sleepy. I am trying to take a quarter of the pill twice day. It still makes me sleepy and not as bad with the half. Do you think this is ok to try it this way.     MD: one quarter of a tablet all at night.  Then once that is tolerated try 1/4 tablet twice daily for a few days and if tolerated increase to 1/4 tablet in the morning and 1/2 tablet at night.  I can get her a lower dosage if needed.  It sounds like she is more sensitive than average.   10/29/23 appt noted: Complaining of dep and anxiety.  Latter getting worse. Didn't tolerate even tiny dose of pramipexole . No other changes since she was here.   Plan: Vraylar 1.5 mg twice weekly.  04/12/24 appt noted: Med: clonazepam  0.5 mg HS, gabapentin  300 HS, lithium  150 mg + 300 mg BID, lamotrigine  150 BID, topiramate  25 mg 2 BID Never took Vraylar bc F dx bladder CA and she was helping him.  He's doing well.  Didn't want to start anything new. Dep is better.  Anxiety is chief concern now.  Keeps her addled.  Can't relax.  Also worry, more mental than anything.  Living on the edge.  No trigger known.   No health px.   Poor memory. Lost 20# this year using [employer]. Dependent on H. Plan: Ok trial clonidine  off label prn anxiety 0.1 mg twice daily  06/16/24 appt noted: Med:  clonazepam  0.5 mg HS, gabapentin  300 HS, lithium  150 mg + 300 mg BID, lamotrigine  150 BID, off topiramate  25 mg 2 BID, clonidine  none Taking iron for anemia for 2-3 years.  Also has RLS but didn't know that could be re: iron.   It affects her a couple of days per weak.   Still concerned about memory issues.  Disc with PCP and may consider MRI.  Can't tolerate any dry mouth and had some from clonidine  so stopped it. Didn't help anxiety.  Dry mouth irritates her. Chronic anxiety unchanged Mood still  all over the place and reactive to things.  Worries about geopolitics.  Keeping gkids is very hard with ADHD GS and likely GD.  M doesn't want her to  use ADD meds until late morning.  Some days just feels like sitting down crying.  Hard job.  Dealing with father  who is oppositional and uncooperative.  Has bladder CA and  Poor self care.   Infrequent panic does occur.  Sleep good with clonazepam  helps anxiety and stress.    psychiatric medication trials include  lithium   Lamotrigine  300, Trileptal, carbamazepine,  SSRIs,  sertraline,  duloxetine Buspirone 2017 bupropion,  Auvelity SE anxiety Clonidine  0.1 SE dry Vraylar, Rexulti, Geodon, Latuda, Saphris, Abilify 10 Pramipexole  couldn't tolerate DT sleepiness topiramate  Vyvanse  40 milligrams effective but dry mouth.  Ritalin  SR 40 NR.   Clonazepam   She also refuses meds that have weight gain risk.   Has tried propranolol prn for anxiety and can tell a touch of difference at 40 mg/D.  Review of Systems:  Review of Systems  Cardiovascular:  Negative for chest pain and palpitations.  Gastrointestinal:  Negative for abdominal pain.  Musculoskeletal:  Positive for arthralgias.  Neurological:  Negative for tremors.  Psychiatric/Behavioral:  Positive for decreased concentration. Negative for agitation, confusion, dysphoric mood, hallucinations, self-injury, sleep disturbance and suicidal ideas. The patient is nervous/anxious. The patient is not hyperactive.     Medications: I have reviewed the patient's current medications.  Current Outpatient Medications  Medication Sig Dispense Refill   Cholecalciferol 25 MCG (1000 UT) tablet Take by mouth.     gabapentin  (NEURONTIN ) 300 MG capsule 1 IN EVENING AND 1 AT BEDTIME 180 capsule 1   lamoTRIgine  (LAMICTAL ) 150 MG tablet Take 1 tablet (150 mg total) by mouth 2 (two) times daily. 180 tablet 1   levothyroxine  (SYNTHROID ) 50 MCG tablet TAKE 1 TABLET BY MOUTH EVERY DAY ON AN EMPTY STOMACH 30-60 MINUTES BEFORE BREAKFAST 90 tablet 3   lithium  carbonate 150 MG capsule Take 1 capsule (150 mg total) by mouth daily at 12 noon. 90 capsule 1    lithium  carbonate 300 MG capsule Take 1 capsule (300 mg total) by mouth 2 (two) times daily with a meal. 180 capsule 1   omeprazole  (PRILOSEC) 40 MG capsule TAKE 1 CAPSULE BY MOUTH EVERY DAY 90 capsule 0   pantoprazole (PROTONIX) 40 MG tablet Take 40 mg by mouth daily.     clonazePAM  (KLONOPIN ) 0.5 MG tablet 1 at night and 1/2 tablet prn for anxiety daily 45 tablet 3   No current facility-administered medications for this visit.    Medication Side Effects: dry mouth.  Allergies: No Known Allergies  Past Medical History:  Diagnosis Date   Bipolar 1 disorder (HCC)    Cardiac arrhythmia    Depression    Endometriosis    Gestational diabetes    Overweight    RLS (restless legs syndrome)     Family History  Problem Relation Age of Onset   Heart disease Mother    Rheum arthritis Mother    Diabetes Father    Endometriosis Sister    Diabetes Sister    Breast cancer Maternal Aunt    Breast cancer Maternal Grandmother    Colon cancer Neg Hx    Ovarian cancer Neg Hx     Social History   Socioeconomic History   Marital status: Married    Spouse name: Not on file   Number of children: 2   Years of education: Not on file  Highest education level: Not on file  Occupational History    Comment: Unemployed. 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  Tobacco Use   Smoking status: Never   Smokeless tobacco: Never  Vaping Use   Vaping status: Never Used  Substance and Sexual Activity   Alcohol use: Yes    Alcohol/week: 0.0 standard drinks of alcohol    Comment: occas   Drug use: No   Sexual activity: Yes    Birth control/protection: Surgical  Other Topics Concern   Not on file  Social History Narrative   Not on file   Social Drivers of Health   Financial Resource Strain: Low Risk  (03/30/2024)   Received from [facility]   Overall Financial Resource Strain (CARDIA)    Difficulty of Paying Living Expenses: Not hard at all  Food Insecurity: No Food  Insecurity (03/30/2024)   [facility]   Hunger Vital Sign    Within the past 12 months, you worried that your food would run out before you got the money to buy more.: Never true    Within the past 12 months, the food you bought just didn't last and you didn't have money to get more.: Never true  Transportation Needs: No Transportation Needs (03/30/2024)   Received from [facility] - Transportation    In the past 12 months, has lack of transportation kept you from medical appointments or from getting medications?: No    Lack of Transportation (Non-Medical): No  Physical Activity: Not on file  Stress: Not on file  Social Connections: Not on file  Intimate Partner Violence: Not on file    Past Medical History, Surgical history, Social history, and Family history were reviewed and updated as appropriate.   Caffeine 6 cups/D bc I'm sleepy.  Please see review of systems for further details on the patient's review from today.   Objective:   Physical Exam:  There were no vitals taken for this visit.  Physical Exam Constitutional:      General: She is not in acute distress.  Musculoskeletal:        General: No deformity.   Neurological:     Mental Status: She is alert and oriented to person, place, and time.     Cranial Nerves: No dysarthria.     Coordination: Coordination normal.   Psychiatric:        Attention and Perception: Attention and perception normal. She does not perceive auditory or visual hallucinations.        Mood and Affect: Mood is anxious. Mood is not depressed. Affect is not labile, blunt, angry or tearful.        Speech: Speech normal.        Behavior: Behavior normal. Behavior is cooperative.        Thought Content: Thought content normal. Thought content is not paranoid or delusional. Thought content does not include homicidal or suicidal ideation. Thought content does not include suicidal plan.         Cognition and Memory: Cognition and memory normal.        Judgment: Judgment normal.     Comments: Insight fair.  Residual depression better with Spring avoidant        Lab Review:     Component Value Date/Time   NA 141 10/24/2020 0858   K 4.5 10/24/2020 0858   CL 106 10/24/2020 0858   CO2 22 10/24/2020 0858   GLUCOSE  94 10/24/2020 0858   BUN 20 10/24/2020 0858   CREATININE 0.65 10/24/2020 0858   CALCIUM 11.0 (H) 10/24/2020 0858   PROT 6.7 10/24/2020 0858   ALBUMIN 4.8 10/24/2020 0858   AST 14 10/24/2020 0858   ALT 13 10/24/2020 0858   ALKPHOS 45 10/24/2020 0858   BILITOT <0.2 10/24/2020 0858   GFRNONAA 103 10/24/2020 0858   GFRAA 119 10/24/2020 0858       Component Value Date/Time   WBC 7.4 10/24/2020 0858   RBC 4.13 10/24/2020 0858   HGB 11.7 10/24/2020 0858   HCT 35.8 10/24/2020 0858   PLT 312 10/24/2020 0858   MCV 87 10/24/2020 0858   MCH 28.3 10/24/2020 0858   MCHC 32.7 10/24/2020 0858   RDW 13.7 10/24/2020 0858    Lithium  Lvl  Date Value Ref Range Status  08/01/2021 0.48 (L) 0.60 - 1.20 mmol/L Final    Comment:    Performed at [facility], [address], [place], [place] [address]    Normal B12 900 03/2023   .res Assessment: Plan:    Keaton Stirewalt was seen today for follow-up, depression, anxiety, sleeping problem and medication reaction.  Diagnoses and all orders for this visit:  Bipolar affective disorder, rapid cycling (HCC)  Social anxiety disorder  Generalized anxiety disorder  Attention deficit hyperactivity disorder (ADHD), combined type  Insomnia due to mental condition -     clonazePAM  (KLONOPIN ) 0.5 MG tablet; 1 at night and 1/2 tablet prn for anxiety daily  Lithium  use  Hypercalcemia   Probably PTSD too. 30 min of face to face time.. 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  Failed multiple other meds.    Chronic issues with staying on meds.  Some fear of meds and fear of SE.  We discussed the short-term risks  associated with benzodiazepines including sedation and increased fall risk among others.  Discussed long-term side effect risk including dependence, potential withdrawal symptoms, and the potential eventual dose-related risk of dementia.  She is on a very low dosage and is not having significant memory complaints.  New studies refute risk.  But disc risk short term memory trouble.  Will keep dose as low as possible DT memory concerns.   Need to work on positive self talk bc she demeans herself. 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  Needs to get back to driving.  She plans ro continue therapy.  Have disc weight loss meds Ozempic etc.  Down 28#.  Still look the same to her.   Body image issues since 2nd grade.   Consider off label naltrexone , NAC,  Overall her bipolar symptoms are not extreme.  She has chronic mild rapid cycling and spends more time depressed than up. we have been unable to completely manage this with multiple meds tried as noted above. However dep interfering more.    She refused meds with wt gain potential.  Dep better with time change and warmer weather.   More avoidant of going out.   She has a history of some seasonal depression   Continue lithium  750 mg daily Continue lamotrigine  150 mg twice daily Continue clonazepam  0.5 mg HS, but ok 0.25 mg daily prn anxiety Continue gabapentin  300 mg at 5 PM and nightly Add B complex vitamin for memory, never tried  04/15/22 normal kidney function, Calcium 10.4, normal TSH 03/23/24 lithium  level 0.7  Counseled patient regarding potential benefits, risks, and side effects of lithium  to include potential risk of lithium  affecting thyroid   and renal function.  Discussed need for periodic lab monitoring to determine drug level and to assess for potential adverse effects.  Counseled patient regarding signs and symptoms of lithium  toxicity and advised that they notify office immediately or seek urgent medical attention if  experiencing these signs and symptoms.  Patient advised to contact office with any questions or concerns. Her typical lithium  levels are in the very low normal range 0.5 because that is about all she can typically tolerate.  FU 3-4 mos  Lorene Macintosh, MD, DFAPA  Please see After Visit Summary for patient specific instructions.  No future appointments.      No orders of the defined types were placed in this encounter.      -------------------------------

## 2024-07-06 ENCOUNTER — Other Ambulatory Visit: Payer: Self-pay | Admitting: Psychiatry

## 2024-07-06 DIAGNOSIS — F319 Bipolar disorder, unspecified: Secondary | ICD-10-CM

## 2024-10-25 ENCOUNTER — Ambulatory Visit: Admitting: Psychiatry

## 2024-10-25 ENCOUNTER — Encounter: Payer: Self-pay | Admitting: Psychiatry

## 2024-10-25 DIAGNOSIS — Z79899 Other long term (current) drug therapy: Secondary | ICD-10-CM

## 2024-10-25 DIAGNOSIS — G2581 Restless legs syndrome: Secondary | ICD-10-CM

## 2024-10-25 DIAGNOSIS — F902 Attention-deficit hyperactivity disorder, combined type: Secondary | ICD-10-CM

## 2024-10-25 DIAGNOSIS — F5105 Insomnia due to other mental disorder: Secondary | ICD-10-CM

## 2024-10-25 DIAGNOSIS — F411 Generalized anxiety disorder: Secondary | ICD-10-CM

## 2024-10-25 DIAGNOSIS — F4010 Social phobia, unspecified: Secondary | ICD-10-CM

## 2024-10-25 DIAGNOSIS — F319 Bipolar disorder, unspecified: Secondary | ICD-10-CM

## 2024-10-25 NOTE — Progress Notes (Signed)
 Briana Fox [id number] 04/28/1971 53 y.o.    Subjective:   Patient ID:  Briana Fox is a 53 y.o. (DOB Apr 10, 1971) female.  Chief Complaint:  Chief Complaint  Patient presents with   Follow-up   Depression   Anxiety    Briana Fox presents to the office today for follow-up of depression and anxiety and binge eating disorder.    When seen May 09, 2019.  She had marked improvement in the past with regard to mood and binge eating with Vyvanse  but could not tolerate the dry mouth and discontinued it.  So at the last appointment we prescribed Concerta  as the next best most similar long-acting stimulant option.  However she called back stating she cannot afford that 1.  Therefore Ritalin -SR 20 mg 1 every morning was sent in and it didn't work.    visit was July 25, 2019 and we increased Ritalin -SR to 20 mg twice daily for residual depression, attention problems, and binge eating off label.  Didn't work out bc cost too much and it didn't help much.  seen September 2020.  No meds were changed.  She had stopped the stimulant because it was not very helpful and was expensive.  March 2021 appointment the following is noted: Getting over Covid now.  Dx 2/23 sx started like the flu. No fever.  Sx remain fatigue and cough. For the most part mood been fair and satisfactory since here.  Fine with the meds. Situation calmer with less demands on her with only usually 1 child at a time.  Helped her mood.  Some depression with situations with several deaths from the Covid.  Still dances but does it outside.  1 of the people in the group died.  Mood still with ups and downs but not severe.  Some depression..  She thinks menopause and struggling with age bc turning 53 yo soon the age when her Mother died.  Sister claims M overdosed.  Patient doesn't know. [name] and describes anxiety as less than before mild- Moderate. Anxiety symptoms include: Excessive  Worry, Panic Symptoms,.   Pt reports no sleep issues. Pt reports that appetite is good. Pt reports that energy is poor and down slightly. Concentration is poor. Forgetful.  Suicidal thoughts:  denied by patient. No med changes  08/02/2020 appointment the following is noted: Still doesn't have smell back. Lots of stress keeping 4 gkids.  But it will drop to 2 when they start school.  At times will notice feeling small amount of rage without a reason.  It's very irritating but brief.  Sometimes out of the blue. Very scared of Covid but hasn't gotten vaccine. Takes clonazepam  at night mainly and asks to increase it bc initial and terminal.  Stay stressed.  My weight consumes me always and will think of it at night for sleep. Some concerns about memory. Clonazepam  0.5 mg 1/4 tablet in the morning and 1/2 tablet at night.  Can't fully stop am doses. Sleep is Ok Put on weight bc binge eating chocolate DT depression.  Binge eating is out of control.  Less on meds and less on weekends. Plan: Continue lithium  750 mg daily Continue lamotrigine  150 mg twice daily Switch to alprazolam  to improve sleep 0.25-0.5 mg HS and 0.25 mg prn anxiety Continue gabapentin  300 mg at 5 PM and nightly Add B complex vitamin for memory  01/30/2021 appointment with the following noted: Goes dancing weekly but otherwise doesn't want to leave  the house. Not sure if she's scared of Covid but doesn't like leaving the house.  More down and anxious over Covid.  Knows a number of people to die.  It's drug on and on. Sleep good here lately.  Tired lately.   Keeping gkids. Taking clonazepam  0.5 mg HS and not alprazolam .  07/22/2021 appt noted: Overall pretty good. Lost 15# but self esteem is not better.  Cut back eating and exercising.  Struggles with her sense of attractiveness.  Buys too many clothes and then doesn't like the way she looks. Drags her mood down. Been a problem for her since 2nd grade. Keeping gkids, 16 mos and gson  55 yo is difficult. D is home schooling. Less hot flashes with gabapentin  Plan: Continue lithium  750 mg daily Continue lamotrigine  150 mg twice daily Continue clonazepam  0.5 mg HS Continue gabapentin  300 mg at 5 PM and nightly Add B complex vitamin for memory, never tried  01/22/22 appt noted: Some seasonal depression.  Not severe.   No SE problems. Some chronic anxiety bu off and on and can panic over something she said or did.  Off and on all day will say she hates herself. Can't sleep without clonazepam   07/31/22 appt noted: Pretty good with less mood swings than before. Depression comes and goes some.  Need to resume therapy.  Situational. Sleep good with meds. Anxiety is so so.  Wouldn't go out to eat at beach.  Still goes dancing bc feels secure there but won't go out to eat with lots of people.  Self esteem issue.  Knows she needs to work on this in therapy.   No SE.   No driving in 3 years DT anxiety.  Need to start back but scared. Always issues with driving.   A couple panic for no reason but usually if unfamiliar situations. Panic over trigger of helicopter [employer] recently near bc one picked up her child that died years ago. Other things can trigger panic like smells that remind her of D that died. Plan no med changes Continue clonazepam  0.5 mg daily as needed for anxiety, gabapentin  300 mg in the evening and at bedtime, lamotrigine  150 mg twice daily, lithium  750 mg nightly  03/25/2023 appointment noted: Continues meds above and takes clonazepam  0.5 mg HS to sleep Sleep is good A little more depression and anxiety.  Through the winter. In therapy with Dr. Marijean.  Rotten self esteem so often won't go out of the house. Trouble adjusting summer bc wearing less clothing makes her feel humongous. Laying awake at night worrying about having to go somewhere tomorrow. Quit driving DT anxiety. Scared of expressing herself.  Doesn't think she can get words out correctly. Some  irritability.  Menopausal. Plan: (NAC) N-Acetylcysteine 1 of the  600 mg capsules twice dailyfor 1 week then 2 twice daily to help with mild cognitive problems and body image issues.   Auvelity 1 in the AM for 1 week then 1 twice daily for depression. If no benefit then call back and I'll RX naltrexone  for the body image issue and weight loss  04/02/23 TC:  Patient c/o dry mouth with Auvelity that is causing her anxiety. She said you would Rx naltrexone  if the Auvelity didn't work.       MD resp:  I agree with her request.  Have her stop the Auvelity.  Please bring any remaining samples she has back at her next appointment.  I sent in the prescription for the naltrexone  and  the instructions will be on the bottle.     06/16/23 appt noted: Benefit naltrexone  initially with binge eating but gradually lost the benefit.  Does help craving for coffee. No sign SE  now .  Initially had N.  Wants help with wt if possible and wanted to retry Wellb with naltrexone .  Mood swings and feeling out of control often.  Often tied to neg self talk.   Stress F witout water and helping him.   Wants to lose 10#.  Plan: Start topiramate  25 mg daily and gradually increased to 50 mg twice daily.    09/15/23 appt noted: Meds: Continue clonazepam  0.5 mg daily as needed for anxiety, gabapentin  300 mg in the evening and at bedtime, lamotrigine  150 mg twice daily, lithium  750 mg nightly, off naltrexone  Couldn't afford topiramate .   Noticed struggling more with dep.  Some related to weight problems.  Got to the point that I don't want to go dancing and that means there's a problem. Can tell if she is getting manic.  But dep now.   Sleep good with clonazepam  but not without it. Start pramipexole  0.5 mg tablet:  1/2 tablet twice daily for 1 week, then 1 tablet twice daily for 1 week, then 1-1/2 tablets twice daily  09/24/23  From MyChart  I haven't been able to take a half of the pill pramipexole . It makes me real  sleepy. I am trying to take a quarter of the pill twice day. It still makes me sleepy and not as bad with the half. Do you think this is ok to try it this way.     MD: one quarter of a tablet all at night.  Then once that is tolerated try 1/4 tablet twice daily for a few days and if tolerated increase to 1/4 tablet in the morning and 1/2 tablet at night.  I can get her a lower dosage if needed.  It sounds like she is more sensitive than average.   10/29/23 appt noted: Complaining of dep and anxiety.  Latter getting worse. Didn't tolerate even tiny dose of pramipexole . No other changes since she was here.   Plan: Vraylar 1.5 mg twice weekly.  04/12/24 appt noted: Med: clonazepam  0.5 mg HS, gabapentin  300 HS, lithium  150 mg + 300 mg BID, lamotrigine  150 BID, topiramate  25 mg 2 BID Never took Vraylar bc F dx bladder CA and she was helping him.  He's doing well.  Didn't want to start anything new. Dep is better.  Anxiety is chief concern now.  Keeps her addled.  Can't relax.  Also worry, more mental than anything.  Living on the edge.  No trigger known.   No health px.   Poor memory. Lost 20# this year using [employer]. Dependent on H. Plan: Ok trial clonidine  off label prn anxiety 0.1 mg twice daily  06/16/24 appt noted: Med:  clonazepam  0.5 mg HS, gabapentin  300 HS, lithium  150 mg + 300 mg BID, lamotrigine  150 BID, off topiramate  25 mg 2 BID, clonidine  none Taking iron for anemia for 2-3 years.  Also has RLS but didn't know that could be re: iron.   It affects her a couple of days per weak.   Still concerned about memory issues.  Disc with PCP and may consider MRI.  Can't tolerate any dry mouth and had some from clonidine  so stopped it. Didn't help anxiety.  Dry mouth irritates her. Chronic anxiety unchanged Mood still all over the place and reactive to things.  Worries about geopolitics.  Keeping gkids is very hard with ADHD GS and likely GD.  M doesn't want her to use ADD meds until late morning.   Some days just feels like sitting down crying.  Hard job.  Dealing with father  who is oppositional and uncooperative.  Has bladder CA and  Poor self care.   Infrequent panic does occur.  Sleep good with clonazepam  helps anxiety and stress.    10/25/24 appt noted; Med:  clonazepam  0.5 mg HS, gabapentin  300 HS, lithium  150 mg + 300 mg BID, lamotrigine  150 BID, off topiramate  25 mg 2 BID, clonidine  none;  Still on iron. Keep gkids at times but less demands.  No outside work.   No SE Mood stable and same.  Sleep ok.  Anxiety without changes   psychiatric medication trials include  lithium   Lamotrigine  300, Trileptal, carbamazepine,  SSRIs,  sertraline,  duloxetine Buspirone 2017 bupropion,  Auvelity SE anxiety Clonidine  0.1 SE dry Vraylar, Rexulti, Geodon, Latuda, Saphris, Abilify 10 Pramipexole  couldn't tolerate DT sleepiness topiramate  Vyvanse  40 milligrams effective but dry mouth.  Ritalin  SR 40 NR.   Clonazepam   She also refuses meds that have weight gain risk.   Has tried propranolol prn for anxiety and can tell a touch of difference at 40 mg/D.  Review of Systems:  Review of Systems  Cardiovascular:  Negative for chest pain and palpitations.  Gastrointestinal:  Negative for abdominal pain.  Musculoskeletal:  Positive for arthralgias.  Neurological:  Negative for tremors.  Psychiatric/Behavioral:  Positive for decreased concentration. Negative for agitation, confusion, dysphoric mood, hallucinations, self-injury, sleep disturbance and suicidal ideas. The patient is nervous/anxious. The patient is not hyperactive.     Medications: I have reviewed the patient's current medications.  Current Outpatient Medications  Medication Sig Dispense Refill   Cholecalciferol 25 MCG (1000 UT) tablet Take by mouth.     clonazePAM  (KLONOPIN ) 0.5 MG tablet 1 at night and 1/2 tablet prn for anxiety daily 45 tablet 3   gabapentin  (NEURONTIN ) 300 MG capsule 1 IN EVENING AND 1 AT BEDTIME 180  capsule 1   lamoTRIgine  (LAMICTAL ) 150 MG tablet TAKE ONE TABLET (150 MG) BY MOUTH TWICE DAILY 180 tablet 1   levothyroxine  (SYNTHROID ) 50 MCG tablet TAKE 1 TABLET BY MOUTH EVERY DAY ON AN EMPTY STOMACH 30-60 MINUTES BEFORE BREAKFAST 90 tablet 3   lithium  carbonate 150 MG capsule Take 1 capsule (150 mg total) by mouth daily at 12 noon. 90 capsule 1   lithium  carbonate 300 MG capsule Take 1 capsule (300 mg total) by mouth 2 (two) times daily with a meal. 180 capsule 1   omeprazole  (PRILOSEC) 40 MG capsule TAKE 1 CAPSULE BY MOUTH EVERY DAY 90 capsule 0   pantoprazole (PROTONIX) 40 MG tablet Take 40 mg by mouth daily.     No current facility-administered medications for this visit.    Medication Side Effects: dry mouth.  Allergies: No Known Allergies  Past Medical History:  Diagnosis Date   Bipolar 1 disorder (HCC)    Cardiac arrhythmia    Depression    Endometriosis    Gestational diabetes    Overweight    RLS (restless legs syndrome)     Family History  Problem Relation Age of Onset   Heart disease Mother    Rheum arthritis Mother    Diabetes Father    Endometriosis Sister    Diabetes Sister    Breast cancer Maternal Aunt    Breast cancer Maternal Grandmother  Colon cancer Neg Hx    Ovarian cancer Neg Hx     Social History   Socioeconomic History   Marital status: Married    Spouse name: Not on file   Number of children: 2   Years of education: Not on file   Highest education level: Not on file  Occupational History    Comment: Unemployed. 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  Tobacco Use   Smoking status: Never   Smokeless tobacco: Never  Vaping Use   Vaping status: Never Used  Substance and Sexual Activity   Alcohol use: Yes    Alcohol/week: 0.0 standard drinks of alcohol    Comment: occas   Drug use: No   Sexual activity: Yes    Birth control/protection: Surgical  Other Topics Concern   Not on file  Social History Narrative   Not on file    Social Drivers of Health   Financial Resource Strain: Low Risk  (03/30/2024)   [facility]   Overall Financial Resource Strain (CARDIA)    Difficulty of Paying Living Expenses: Not hard at all  Food Insecurity: No Food Insecurity (03/30/2024)   Received from [facility]   Hunger Vital Sign    Within the past 12 months, you worried that your food would run out before you got the money to buy more.: Never true    Within the past 12 months, the food you bought just didn't last and you didn't have money to get more.: Never true  Transportation Needs: No Transportation Needs (03/30/2024)   Received from [facility] - Transportation    In the past 12 months, has lack of transportation kept you from medical appointments or from getting medications?: No    Lack of Transportation (Non-Medical): No  Physical Activity: Not on file  Stress: Not on file  Social Connections: Not on file  Intimate Partner Violence: Not on file    Past Medical History, Surgical history, Social history, and Family history were reviewed and updated as appropriate.   Caffeine 6 cups/D bc I'm sleepy.  Please see review of systems for further details on the patient's review from today.   Objective:   Physical Exam:  There were no vitals taken for this visit.  Physical Exam Constitutional:      General: She is not in acute distress. Musculoskeletal:        General: No deformity.  Neurological:     Mental Status: She is alert and oriented to person, place, and time.     Cranial Nerves: No dysarthria.     Coordination: Coordination normal.  Psychiatric:        Attention and Perception: Attention and perception normal. She does not perceive auditory or visual hallucinations.        Mood and Affect: Mood is anxious. Mood is not depressed. Affect is not labile, blunt, angry or tearful.        Speech: Speech normal.        Behavior:  Behavior normal. Behavior is cooperative.        Thought Content: Thought content normal. Thought content is not paranoid or delusional. Thought content does not include homicidal or suicidal ideation. Thought content does not include suicidal plan.        Cognition and Memory: Cognition and memory normal.        Judgment: Judgment normal.     Comments: Insight fair.  Residual depression  better with Spring avoidant        Lab Review:     Component Value Date/Time   NA 141 10/24/2020 0858   K 4.5 10/24/2020 0858   CL 106 10/24/2020 0858   CO2 22 10/24/2020 0858   GLUCOSE 94 10/24/2020 0858   BUN 20 10/24/2020 0858   CREATININE 0.65 10/24/2020 0858   CALCIUM 11.0 (H) 10/24/2020 0858   PROT 6.7 10/24/2020 0858   ALBUMIN 4.8 10/24/2020 0858   AST 14 10/24/2020 0858   ALT 13 10/24/2020 0858   ALKPHOS 45 10/24/2020 0858   BILITOT <0.2 10/24/2020 0858   GFRNONAA 103 10/24/2020 0858   GFRAA 119 10/24/2020 0858       Component Value Date/Time   WBC 7.4 10/24/2020 0858   RBC 4.13 10/24/2020 0858   HGB 11.7 10/24/2020 0858   HCT 35.8 10/24/2020 0858   PLT 312 10/24/2020 0858   MCV 87 10/24/2020 0858   MCH 28.3 10/24/2020 0858   MCHC 32.7 10/24/2020 0858   RDW 13.7 10/24/2020 0858    Lithium  Lvl  Date Value Ref Range Status  08/01/2021 0.48 (L) 0.60 - 1.20 mmol/L Final    Comment:    Performed at [facility], [address] [place], [place] [address]    Normal B12 900 03/2023  09/22/24 ferritin 47  .res Assessment: Plan:    Briana Fox was seen today for follow-up, depression and anxiety.  Diagnoses and all orders for this visit:  Bipolar affective disorder, rapid cycling (HCC)  Social anxiety disorder  Generalized anxiety disorder  Attention deficit hyperactivity disorder (ADHD), combined type  Insomnia due to mental condition  Lithium  use  Restless legs syndrome    Probably PTSD too. 30 min of face to face time.. We discussed her  binge eating and TRD.  Failed multiple other meds.   Compliant and relatively stable.  We discussed the short-term risks associated with benzodiazepines including sedation and increased fall risk among others.  Discussed long-term side effect risk including dependence, potential withdrawal symptoms, and the potential eventual dose-related risk of dementia.  She is on a very low dosage and is not having significant memory complaints.  New studies refute risk.  But disc risk short term memory trouble.  Will keep dose as low as possible DT memory concerns.   Overall her bipolar symptoms are not extreme.  She has chronic mild rapid cycling and spends more time depressed than up. we have been unable to completely manage this with multiple meds tried as noted above. However dep interfering more.    She refused meds with wt gain potential.  Dep better with time change and warmer weather.   some avoidant of going out.   She has a history of some seasonal depression   Continue lithium  750 mg daily Continue lamotrigine  150 mg twice daily Continue clonazepam  0.5 mg HS, but ok 0.25 mg daily prn anxiety Continue gabapentin  300 mg at 5 PM and nightly Add B complex vitamin for memory, never tried  04/15/22 normal kidney function, Calcium 10.4, normal TSH 03/23/24 lithium  level 0.7 09/22/24 Cr 0.6, TSH 3.726, normal b12 and folate. Reviewed labs with her.  Counseled patient regarding potential benefits, risks, and side effects of lithium  to include potential risk of lithium  affecting thyroid  and renal function.  Discussed need for periodic lab monitoring to determine drug level and to assess for potential adverse effects.  Counseled patient regarding signs and symptoms of lithium  toxicity and advised that they notify  office immediately or seek urgent medical attention if experiencing these signs and symptoms.  Patient advised to contact office with any questions or concerns. Her typical lithium  levels are in the  very low normal range 0.5 because that is about all she can typically tolerate.  RE RLS and iron, continue oral bc stable.  FU 6 mos  Lorene Macintosh, MD, DFAPA  Please see After Visit Summary for patient specific instructions.  Future Appointments  Date Time Provider Department Center  04/10/2025  4:00 PM Cottle, Lorene KANDICE Raddle., MD [facility] None        No orders of the defined types were placed in this encounter.      -------------------------------

## 2024-11-09 ENCOUNTER — Other Ambulatory Visit: Payer: Self-pay | Admitting: Psychiatry

## 2024-11-09 DIAGNOSIS — F411 Generalized anxiety disorder: Secondary | ICD-10-CM

## 2025-01-03 ENCOUNTER — Other Ambulatory Visit: Payer: Self-pay | Admitting: Psychiatry

## 2025-01-03 DIAGNOSIS — F319 Bipolar disorder, unspecified: Secondary | ICD-10-CM

## 2025-04-10 ENCOUNTER — Ambulatory Visit: Admitting: Psychiatry
